# Patient Record
Sex: Male | Born: 1990 | Race: Black or African American | Hispanic: No | Marital: Single | Smoking: Former smoker
Health system: Southern US, Community
[De-identification: ages and names within clinical notes are randomized; demographics above are authoritative.]

## PROBLEM LIST (undated history)

## (undated) DIAGNOSIS — J45909 Unspecified asthma, uncomplicated: Secondary | ICD-10-CM

## (undated) HISTORY — PX: NECK SURGERY: SHX720

---
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
## 2015-01-01 ENCOUNTER — Emergency Department
Admission: EM | Admit: 2015-01-01 | Discharge: 2015-01-01 | Disposition: A | Discharge status: Home / Self Care | Payer: Medicare Other | Attending: Emergency Medicine | Admitting: Emergency Medicine

## 2015-01-01 ENCOUNTER — Emergency Department: Payer: Medicare Other

## 2015-01-01 ENCOUNTER — Other Ambulatory Visit: Payer: Self-pay

## 2015-01-01 DIAGNOSIS — R079 Chest pain, unspecified: Secondary | ICD-10-CM | POA: Diagnosis not present

## 2015-01-01 DIAGNOSIS — R0602 Shortness of breath: Secondary | ICD-10-CM | POA: Diagnosis not present

## 2015-01-01 LAB — COMPREHENSIVE METABOLIC PANEL
ALBUMIN: 4.7 g/dL (ref 3.5–5.0)
ALK PHOS: 54 U/L (ref 38–126)
ALT: 14 U/L — ABNORMAL LOW (ref 17–63)
AST: 21 U/L (ref 15–41)
Anion gap: 7 (ref 5–15)
BILIRUBIN TOTAL: 0.7 mg/dL (ref 0.3–1.2)
BUN: 8 mg/dL (ref 6–20)
CHLORIDE: 99 mmol/L — AB (ref 101–111)
CO2: 29 mmol/L (ref 22–32)
Calcium: 9.3 mg/dL (ref 8.9–10.3)
Creatinine, Ser: 0.83 mg/dL (ref 0.61–1.24)
GFR calc Af Amer: >60 mL/min (ref >60)
Glucose, Bld: 113 mg/dL — ABNORMAL HIGH (ref 65–99)
POTASSIUM: 3.6 mmol/L (ref 3.5–5.1)
Sodium: 135 mmol/L (ref 135–145)
Total Protein: 8.7 g/dL — ABNORMAL HIGH (ref 6.5–8.1)

## 2015-01-01 LAB — CBC
HCT: 48.4 % (ref 40.0–52.0)
Hemoglobin: 15.6 g/dL (ref 13.0–18.0)
MCH: 27.8 pg (ref 26.0–34.0)
MCHC: 32.3 g/dL (ref 32.0–36.0)
MCV: 86.1 fL (ref 80.0–100.0)
Platelets: 193 10*3/uL (ref 150–440)
RBC: 5.62 MIL/uL (ref 4.40–5.90)
RDW: 13.5 % (ref 11.5–14.5)
WBC: 5.9 10*3/uL (ref 3.8–10.6)

## 2015-01-01 LAB — TROPONIN I: Troponin I: <0.03 ng/mL (ref <0.031)

## 2015-01-01 MED ORDER — RANITIDINE HCL 75 MG PO TABS: 75.0000 mg | ORAL_TABLET | Freq: Two times a day (BID) | ORAL | Status: DC

## 2015-01-01 NOTE — ED Notes (Signed)
Pt in with co chest pain and shob x 2 days.

## 2015-01-01 NOTE — ED Notes (Signed)
First nurse note: pt presents to STAT desk ambulatory with steady gait. States he feels like he has "gas" in his chest and is having difficulty breathing. Pt has no increased work of breathing noted at this time.

## 2015-01-01 NOTE — ED Notes (Signed)
AAOx3.  Skin warm and dry.  No SOB/ DOE.  DEnies any c/o pain.  D/C home

## 2015-01-01 NOTE — ED Provider Notes (Signed)
[facility] Emergency Department Provider Note  ____________________________________________  Time seen: Approximately 9 PM  I have reviewed the triage vital signs and the nursing notes.   HISTORY  Chief Complaint Chest Pain    HPI Derek Spencer is a 24 y.o. male without any medical history who presents today with chest pain over the past 2 weeks. He says the pain is bilateral to his anterior chest and nonradiating. He has had some associated shortness of breath today when the pain has gotten severe. 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 He has not had any worsening of his shortness of breath or worsening of his chest pain with activity at work. He says that he also does a lot of heavy lifting at work lifting up 200 pounds at a time. He says that the pain has not been worsened with lifting either. He does say that he has had some increased stress recently and he thinks that may be contributing to the chest pain. He says that the pain improves at night and is worse during the daytime. He denies any nausea or vomiting. He has not had any relatives who have died suddenly at a young age. There is no increase in the pain with deep breathing. He has not had any recent long car trips or plane trips. The patient recently stopped smoking several days ago. No recent surgeries. He says that he sometimes hears and feels a gurgling in his stomach when he has the pain. Says that he has had increased belching as well as passing gas. However, he says the belching and passing gas do not alleviate the pain. He says that the pain is sometimes lessened after eating food. Does not report any blood in his stool.   No past medical history on file.  There are no active problems to display for this patient.   No past surgical history on file.  No current outpatient prescriptions on file.  Allergies Review of  patient's allergies indicates no known allergies.  No family history on file.  Social History History  Substance Use Topics  . Smoking status: Not on file  . Smokeless tobacco: Not on file  . Alcohol Use: Not on file    Review of Systems Constitutional: No fever/chills Eyes: No visual changes. ENT: No sore throat. Cardiovascular: As above Respiratory: As above Gastrointestinal: No abdominal pain.  No nausea, no vomiting.  No diarrhea.  No constipation. Genitourinary: Negative for dysuria. Musculoskeletal: Negative for back pain. Skin: Negative for rash. Neurological: Negative for headaches, focal weakness or numbness.  10-point ROS otherwise negative.  ____________________________________________   PHYSICAL EXAM:  VITAL SIGNS: ED Triage Vitals  Enc Vitals Group     BP 01/01/15 2013 160/104 mmHg     Pulse Rate 01/01/15 2013 84     Resp 01/01/15 2013 18     Temp 01/01/15 2013 98.6 F (37 C)     Temp src --      SpO2 01/01/15 2013 98 %     Weight 01/01/15 2013 190 lb (86.183 kg)     Height 01/01/15 2013  (1.854 m)     Head Cir --      Peak Flow --      Pain Score 01/01/15 2014 5     Pain Loc --      Pain Edu? --      Excl. in GC? --  Constitutional: Alert and oriented. Well appearing and in no acute distress. Eyes: Conjunctivae are normal. PERRL. EOMI. Head: Atraumatic. Nose: No congestion/rhinnorhea. Mouth/Throat: Mucous membranes are moist.  Oropharynx non-erythematous. Neck: No stridor.   Cardiovascular: Normal rate, regular rhythm. Grossly normal heart sounds.  Good peripheral circulation. No friction rub. Respiratory: Normal respiratory effort.  No retractions. Lungs CTAB. Gastrointestinal: Soft and nontender. No distention. No abdominal bruits. No CVA tenderness. Musculoskeletal: No lower extremity tenderness nor edema.  No joint effusions. Neurologic:  Normal speech and language. No gross focal neurologic deficits are appreciated. Speech is  normal. No gait instability. Skin:  Skin is warm, dry and intact. No rash noted. Psychiatric: Mood and affect are normal. Speech and behavior are normal.  ____________________________________________   LABS (all labs ordered are listed, but only abnormal results are displayed)  Labs Reviewed  COMPREHENSIVE METABOLIC PANEL - Abnormal; Notable for the following:    Chloride 99 (*)    Glucose, Bld 113 (*)    Total Protein 8.7 (*)    ALT 14 (*)    All other components within normal limits  CBC  TROPONIN I   ____________________________________________  EKG  ED ECG REPORT I, Arelia Longest, the attending physician, personally viewed and interpreted this ECG.   Date: 01/01/2015  EKG Time: 2018  Rate: 66  Rhythm: normal sinus rhythm  Axis: Normal axis  Intervals:none  ST&T Change: Concave and minimal ST elevation to V2 and V3. Consistent with J-point elevation.    ____________________________________________  RADIOLOGY  Chest x-ray NAD. I personally reviewed these images as well. ____________________________________________   PROCEDURES    ____________________________________________   INITIAL IMPRESSION / ASSESSMENT AND PLAN / ED COURSE  Pertinent labs & imaging results that were available during my care of the patient were reviewed by me and considered in my medical decision making (see chart for details).  ----------------------------------------- 10:03 PM on 01/01/2015 -----------------------------------------  Blood pressure rechecked and is 149/92. Patient to follow-up with his primary care doctor. The patient is perc negative.  Patient to try ibuprofen and muscle rubs at home such as icy hot or Aspercreme. Also appears to be some GI component to this. We will prescribe ranitidine for trial of pain reduction at home. Patient to follow-up with his primary care doctor at [facility] for further evaluation as well as trending of his blood pressure. Extremely  unlikely to be ACS as the patient is for a young for this. No friction rub and ST elevation more consistent with J-point.  ____________________________________________   FINAL CLINICAL IMPRESSION(S) / ED DIAGNOSES  Acute chest pain. Initial visit.    Myrna Blazer, MD 01/01/15 [phone]

## 2015-01-01 NOTE — Discharge Instructions (Signed)

## 2015-06-13 ENCOUNTER — Encounter: Payer: Self-pay | Admitting: Emergency Medicine

## 2015-06-13 ENCOUNTER — Emergency Department
Admission: EM | Admit: 2015-06-13 | Discharge: 2015-06-13 | Disposition: A | Discharge status: Home / Self Care | Payer: Medicare Other | Attending: Emergency Medicine | Admitting: Emergency Medicine

## 2015-06-13 DIAGNOSIS — Z79899 Other long term (current) drug therapy: Secondary | ICD-10-CM | POA: Insufficient documentation

## 2015-06-13 DIAGNOSIS — J45901 Unspecified asthma with (acute) exacerbation: Secondary | ICD-10-CM | POA: Insufficient documentation

## 2015-06-13 DIAGNOSIS — F17200 Nicotine dependence, unspecified, uncomplicated: Secondary | ICD-10-CM | POA: Diagnosis not present

## 2015-06-13 DIAGNOSIS — J069 Acute upper respiratory infection, unspecified: Secondary | ICD-10-CM | POA: Insufficient documentation

## 2015-06-13 DIAGNOSIS — R0981 Nasal congestion: Secondary | ICD-10-CM | POA: Diagnosis present

## 2015-06-13 MED ORDER — HYDROCOD POLST-CPM POLST ER 10-8 MG/5ML PO SUER
5.0000 mL | Freq: Once | ORAL | Status: AC
  Administered 2015-06-13: 5 mL via ORAL
  Filled 2015-06-13: qty 5

## 2015-06-13 MED ORDER — IBUPROFEN 800 MG PO TABS: 800.0000 mg | ORAL_TABLET | Freq: Three times a day (TID) | ORAL | Status: DC | PRN

## 2015-06-13 MED ORDER — IBUPROFEN 800 MG PO TABS
800.0000 mg | ORAL_TABLET | Freq: Once | ORAL | Status: AC
  Administered 2015-06-13: 800 mg via ORAL
  Filled 2015-06-13: qty 1

## 2015-06-13 MED ORDER — PROMETHAZINE-DM 6.25-15 MG/5ML PO SYRP: 5.0000 mL | ORAL_SOLUTION | Freq: Four times a day (QID) | ORAL | Status: DC | PRN

## 2015-06-13 NOTE — ED Notes (Signed)
pt reports feeling sick, shortness of breath with productive cough

## 2015-06-13 NOTE — Discharge Instructions (Signed)
Upper Respiratory Infection, Adult Most upper respiratory infections (URIs) are a viral infection of the air passages leading to the lungs. A URI affects the nose, throat, and upper air passages. The most common type of URI is nasopharyngitis and is typically referred to as "the common cold." URIs run their course and usually go away on their own. Most of the time, a URI does not require medical attention, but sometimes a bacterial infection in the upper airways can follow a viral infection. This is called a secondary infection. Sinus and middle ear infections are common types of secondary upper respiratory infections. Bacterial pneumonia can also complicate a URI. A URI can worsen asthma and chronic obstructive pulmonary disease (COPD). Sometimes, these complications can require emergency medical care and may be life threatening.  CAUSES Almost all URIs are caused by viruses. A virus is a type of germ and can spread from one person to another.  RISKS FACTORS You may be at risk for a URI if:   You smoke.   You have chronic heart or lung disease.  You have a weakened defense (immune) system.   You are very young or very old.   You have nasal allergies or asthma.  You work in crowded or poorly ventilated areas.  You work in health care facilities or schools. SIGNS AND SYMPTOMS  Symptoms typically develop 2-3 days after you come in contact with a cold virus. Most viral URIs last 7-10 days. However, viral URIs from the influenza virus (flu virus) can last 14-18 days and are typically more severe. Symptoms may include:   Runny or stuffy (congested) nose.   Sneezing.   Cough.   Sore throat.   Headache.   Fatigue.   Fever.   Loss of appetite.   Pain in your forehead, behind your eyes, and over your cheekbones (sinus pain).  Muscle aches.  DIAGNOSIS  Your health care provider may diagnose a URI by:  Physical exam.  Tests to check that your symptoms are not due to  another condition such as:  Strep throat.  Sinusitis.  Pneumonia.  Asthma. TREATMENT  A URI goes away on its own with time. It cannot be cured with medicines, but medicines may be prescribed or recommended to relieve symptoms. Medicines may help:  Reduce your fever.  Reduce your cough.  Relieve nasal congestion. HOME CARE INSTRUCTIONS   Take medicines only as directed by your health care provider.   Gargle warm saltwater or take cough drops to comfort your throat as directed by your health care provider.  Use a warm mist humidifier or inhale steam from a shower to increase air moisture. This may make it easier to breathe.  Drink enough fluid to keep your urine clear or pale yellow.   Eat soups and other clear broths and maintain good nutrition.   Rest as needed.   Return to work when your temperature has returned to normal or as your health care provider advises. You may need to stay home longer to avoid infecting others. You can also use a face mask and careful hand washing to prevent spread of the virus.  Increase the usage of your inhaler if you have asthma.   Do not use any tobacco products, including cigarettes, chewing tobacco, or electronic cigarettes. If you need help quitting, ask your health care provider. PREVENTION  The best way to protect yourself from getting a cold is to practice good hygiene.   Avoid oral or hand contact with people with cold   symptoms.   Wash your hands often if contact occurs.  There is no clear evidence that vitamin C, vitamin E, echinacea, or exercise reduces the chance of developing a cold. However, it is always recommended to get plenty of rest, exercise, and practice good nutrition.  SEEK MEDICAL CARE IF:   You are getting worse rather than better.   Your symptoms are not controlled by medicine.   You have chills.  You have worsening shortness of breath.  You have brown or red mucus.  You have yellow or brown nasal  discharge.  You have pain in your face, especially when you bend forward.  You have a fever.  You have swollen neck glands.  You have pain while swallowing.  You have white areas in the back of your throat. SEEK IMMEDIATE MEDICAL CARE IF:   You have severe or persistent:  Headache.  Ear pain.  Sinus pain.  Chest pain.  You have chronic lung disease and any of the following:  Wheezing.  Prolonged cough.  Coughing up blood.  A change in your usual mucus.  You have a stiff neck.  You have changes in your:  Vision.  Hearing.  Thinking.  Mood. MAKE SURE YOU:   Understand these instructions.  Will watch your condition.  Will get help right away if you are not doing well or get worse.   This information is not intended to replace advice given to you by your health care provider. Make sure you discuss any questions you have with your health care provider.   Document Released: 12/22/2000 Document Revised: 11/12/2014 Document Reviewed: 10/03/2013 Elsevier Interactive Patient Education 2016 [employer]  

## 2015-06-13 NOTE — ED Provider Notes (Signed)
[facility] Emergency Department Provider Note  ____________________________________________  Time seen: Approximately 9:20 PM  I have reviewed the triage vital signs and the nursing notes.   HISTORY  Chief Complaint URI    HPI BRODY BONNEAU is a 24 y.o. male patient complain of nasal and chest congestion and malaise for 3 days. Patient stated he had a coughing spell with developmental wheezing. Patient state he has a history of asthma as a child but outgrew them but it felt like an asthma attack. Patient stated wheezing resolved couple hours ago prior to arrival. Patient denies any nausea, vomiting, diarrhea. No palliative measures taken for this complaint.   History reviewed. No pertinent past medical history.  There are no active problems to display for this patient.   Past Surgical History  Procedure Laterality Date  . Neck surgery      Current Outpatient Rx  Name  Route  Sig  Dispense  Refill  . ranitidine (ZANTAC) 75 MG tablet   Oral   Take 1 tablet (75 mg total) by mouth 2 (two) times daily.   30 tablet   0     Allergies Review of patient's allergies indicates no known allergies.  History reviewed. No pertinent family history.  Social History Social History  Substance Use Topics  . Smoking status: Current Every Day Smoker  . Smokeless tobacco: None  . Alcohol Use: Yes     Comment: social    Review of Systems Constitutional: No fever/chills Eyes: No visual changes. ENT: No sore throat. Positive for sinus congestion Cardiovascular: Denies chest pain. Respiratory: Denies shortness of breath. Nonproductive cough. Chest congestion Gastrointestinal: No abdominal pain.  No nausea, no vomiting.  No diarrhea.  No constipation. Genitourinary: Negative for dysuria. Musculoskeletal: Negative for back pain. Skin: Negative for rash. Neurological: Negative for headaches, focal weakness or numbness. 10-point ROS otherwise  negative.  ____________________________________________   PHYSICAL EXAM:  VITAL SIGNS: ED Triage Vitals  Enc Vitals Group     BP 06/13/15 2059 164/68 mmHg     Pulse Rate 06/13/15 2059 70     Resp 06/13/15 2059 20     Temp 06/13/15 2059 98 F (36.7 C)     Temp Source 06/13/15 2059 Oral     SpO2 06/13/15 2059 100 %     Weight 06/13/15 2059 200 lb (90.719 kg)     Height 06/13/15 2059  (1.854 m)     Head Cir --      Peak Flow --      Pain Score 06/13/15 2100 2     Pain Loc --      Pain Edu? --      Excl. in GC? --     Constitutional: Alert and oriented. Well appearing and in no acute distress. Eyes: Conjunctivae are normal. PERRL. EOMI. Head: Atraumatic. Nose: Bilateral maxillary guarding and edematous bilateral nasal turbinates.  Mouth/Throat: Mucous membranes are moist.  Oropharynx non-erythematous. Postnasal drainage Neck: No stridor.  No cervical spine tenderness to palpation. Hematological/Lymphatic/Immunilogical: No cervical lymphadenopathy. Cardiovascular: Normal rate, regular rhythm. Grossly normal heart sounds.  Good peripheral circulation. Elevated blood pressure Respiratory: Normal respiratory effort.  No retractions. Lungs CTAB. Nonproductive cough Gastrointestinal: Soft and nontender. No distention. No abdominal bruits. No CVA tenderness. Musculoskeletal: No lower extremity tenderness nor edema.  No joint effusions. Neurologic:  Normal speech and language. No gross focal neurologic deficits are appreciated. No gait instability. Skin:  Skin is warm, dry and intact. No rash noted. Psychiatric:  Mood and affect are normal. Speech and behavior are normal.  ____________________________________________   LABS (all labs ordered are listed, but only abnormal results are displayed)  Labs Reviewed - No data to  display ____________________________________________  EKG   ____________________________________________  RADIOLOGY   ____________________________________________   PROCEDURES  Procedure(s) performed: None  Critical Care performed: No  ____________________________________________   INITIAL IMPRESSION / ASSESSMENT AND PLAN / ED COURSE  Pertinent labs & imaging results that were available during my care of the patient were reviewed by me and considered in my medical decision making (see chart for details).  Upper rest or infection. Patient given instruction in home care. Patient given a prescription for Phenergan DM and ibuprofen. Patient [facility] condition persists. ____________________________________________   FINAL CLINICAL IMPRESSION(S) / ED DIAGNOSES  Final diagnoses:  Upper respiratory infection      Joni ReiningRonald K Smith, PA-C 06/13/15 2129  Loleta Roseory Forbach, MD 06/13/15 [phone]

## 2015-06-13 NOTE — ED Notes (Signed)
Pt came to ER from home with significant other. Pt reports that he has been feeling congested and "not well" for about 3 days now. Pt reports sinus congestion. Pt also reports that his chest feels "tight about a 3/10". Pt with hx of asthma that he outgrew as a child. Pt does report that earlier today he was wheezing.  No other medical issues verbalized at this time. Pt speaking in full sentences.

## 2015-08-09 ENCOUNTER — Emergency Department
Admission: EM | Admit: 2015-08-09 | Discharge: 2015-08-09 | Disposition: A | Discharge status: Home / Self Care | Payer: Medicare Other | Attending: Emergency Medicine | Admitting: Emergency Medicine

## 2015-08-09 ENCOUNTER — Encounter: Payer: Self-pay | Admitting: Emergency Medicine

## 2015-08-09 DIAGNOSIS — R5383 Other fatigue: Secondary | ICD-10-CM | POA: Diagnosis not present

## 2015-08-09 DIAGNOSIS — R1012 Left upper quadrant pain: Secondary | ICD-10-CM | POA: Diagnosis not present

## 2015-08-09 DIAGNOSIS — Z79899 Other long term (current) drug therapy: Secondary | ICD-10-CM | POA: Diagnosis not present

## 2015-08-09 DIAGNOSIS — M791 Myalgia: Secondary | ICD-10-CM | POA: Diagnosis not present

## 2015-08-09 DIAGNOSIS — R509 Fever, unspecified: Secondary | ICD-10-CM | POA: Diagnosis not present

## 2015-08-09 DIAGNOSIS — R590 Localized enlarged lymph nodes: Secondary | ICD-10-CM | POA: Diagnosis not present

## 2015-08-09 DIAGNOSIS — F17200 Nicotine dependence, unspecified, uncomplicated: Secondary | ICD-10-CM | POA: Diagnosis not present

## 2015-08-09 DIAGNOSIS — R52 Pain, unspecified: Secondary | ICD-10-CM | POA: Diagnosis present

## 2015-08-09 DIAGNOSIS — R5381 Other malaise: Secondary | ICD-10-CM | POA: Diagnosis not present

## 2015-08-09 LAB — RAPID INFLUENZA A&B ANTIGENS
Influenza A (ARMC): NEGATIVE
Influenza B (ARMC): NEGATIVE

## 2015-08-09 LAB — MONONUCLEOSIS SCREEN: MONO SCREEN: NEGATIVE

## 2015-08-09 NOTE — Discharge Instructions (Signed)
Fatigue  Fatigue is feeling tired all of the time, a lack of energy, or a lack of motivation. Occasional or mild fatigue is often a normal response to activity or life in general. However, long-lasting (chronic) or extreme fatigue may indicate an underlying medical condition.  HOME CARE INSTRUCTIONS   Watch your fatigue for any changes. The following actions may help to lessen any discomfort you are feeling:  · Talk to your health care provider about how much sleep you need each night. Try to get the required amount every night.  · Take medicines only as directed by your health care provider.  · Eat a healthy and nutritious diet. Ask your health care provider if you need help changing your diet.  · Drink enough fluid to keep your urine clear or pale yellow.  · Practice ways of relaxing, such as yoga, meditation, massage therapy, or acupuncture.  · Exercise regularly.    · Change situations that cause you stress. Try to keep your work and personal routine reasonable.  · Do not abuse illegal drugs.  · Limit alcohol intake to no more than 1 drink per day for nonpregnant women and 2 drinks per day for men. One drink equals 12 ounces of beer, 5 ounces of wine, or 1½ ounces of hard liquor.  · Take a multivitamin, if directed by your health care provider.  SEEK MEDICAL CARE IF:   · Your fatigue does not get better.  · You have a fever.    · You have unintentional weight loss or gain.  · You have headaches.    · You have difficulty:      Falling asleep.    Sleeping throughout the night.  · You feel angry, guilty, anxious, or sad.     · You are unable to have a bowel movement (constipation).    · You skin is dry.     · Your legs or another part of your body is swollen.    SEEK IMMEDIATE MEDICAL CARE IF:   · You feel confused.    · Your vision is blurry.  · You feel faint or pass out.    · You have a severe headache.    · You have severe abdominal, pelvic, or back pain.    · You have chest pain, shortness of breath, or an  irregular or fast heartbeat.    · You are unable to urinate or you urinate less than normal.    · You develop abnormal bleeding, such as bleeding from the rectum, vagina, nose, lungs, or nipples.  · You vomit blood.     · You have thoughts about harming yourself or committing suicide.    · You are worried that you might harm someone else.       This information is not intended to replace advice given to you by your health care provider. Make sure you discuss any questions you have with your health care provider.     Document Released: 04/25/2007 Document Revised: 07/19/2014 Document Reviewed: 10/30/2013  Elsevier Interactive Patient Education ©2016 [employer]

## 2015-08-09 NOTE — ED Provider Notes (Signed)
[facility] Emergency Department Provider Note  ____________________________________________  Time seen: Approximately 11:55 AM  I have reviewed the triage vital signs and the nursing notes.   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 Patient states that symptoms began with "aching." Patient states that initially aching was more arthralgias to bilateral lower extremity joints and then increase to includes general achiness. Patient has had a subjective tactile fever at home. Patient denies any nasal congestion, sore throat, cough, nausea or vomiting, abdominal pain. Patient does endorse feeling "really tired."   History reviewed. No pertinent past medical history.  There are no active problems to display for this patient.   Past Surgical History  Procedure Laterality Date  . Neck surgery      Current Outpatient Rx  Name  Route  Sig  Dispense  Refill  . ibuprofen (ADVIL,MOTRIN) 800 MG tablet   Oral   Take 1 tablet (800 mg total) by mouth every 8 (eight) hours as needed.   30 tablet   0   . promethazine-dextromethorphan (PROMETHAZINE-DM) 6.25-15 MG/5ML syrup   Oral   Take 5 mLs by mouth 4 (four) times daily as needed for cough.   118 mL   0   . ranitidine (ZANTAC) 75 MG tablet   Oral   Take 1 tablet (75 mg total) by mouth 2 (two) times daily.   30 tablet   0     Allergies Review of patient's allergies indicates no known allergies.  History reviewed. No pertinent family history.  Social History Social History  Substance Use Topics  . Smoking status: Current Every Day Smoker  . Smokeless tobacco: None  . Alcohol Use: Yes     Comment: social     Review of Systems  Constitutional: Positive for fever/chills. Endorses significant tiredness. Eyes: No visual changes. No discharge ENT: No sore  throat. Cardiovascular: no chest pain. Respiratory: no cough. No SOB. Gastrointestinal: No abdominal pain.  No nausea, no vomiting.  No diarrhea.  No constipation. Genitourinary: Negative for dysuria. No hematuria Musculoskeletal: Negative for back pain. Endorses myalgias. Skin: Negative for rash. Neurological: Negative for headaches, focal weakness or numbness. 10-point ROS otherwise negative.  ____________________________________________   PHYSICAL EXAM:  VITAL SIGNS: ED Triage Vitals  Enc Vitals Group     BP 08/09/15 1053 156/84 mmHg     Pulse Rate 08/09/15 1053 92     Resp 08/09/15 1053 18     Temp 08/09/15 1053 98.5 F (36.9 C)     Temp Source 08/09/15 1053 Oral     SpO2 08/09/15 1053 100 %     Weight 08/09/15 1053 210 lb (95.255 kg)     Height 08/09/15 1053  (1.854 m)     Head Cir --      Peak Flow --      Pain Score 08/09/15 1053 7     Pain Loc --      Pain Edu? --      Excl. in GC? --      Constitutional: Alert and oriented. Well appearing and in no acute distress. Eyes: Conjunctivae are normal. PERRL. EOMI. Head: Atraumatic. ENT:      Ears: ACs and TMs are unremarkable bilaterally.      Nose: No congestion/rhinnorhea.      Mouth/Throat: Mucous membranes are moist. Oropharynx is nonerythematous and nonedematous  Neck: No stridor.   Hematological/Lymphatic/Immunilogical: Diffuse anterior cervical lymphadenopathy. Cardiovascular: Normal rate, regular rhythm. Normal S1 and S2.  Good peripheral circulation. Respiratory: Normal respiratory effort without tachypnea or retractions. Lungs CTAB. Gastrointestinal: Soft to palpation. Patient reports mild tenderness to palpation left upper quadrant. No palpable abnormality. No palpable splenomegaly.. No distention. No CVA tenderness. Musculoskeletal: No lower extremity tenderness nor edema.  No joint effusions. Neurologic:  Normal speech and language. No gross focal neurologic deficits are appreciated. Cranial nerves  II through XII are grossly normal. Skin:  Skin is warm, dry and intact. No rash noted. Psychiatric: Mood and affect are normal. Speech and behavior are normal. Patient exhibits appropriate insight and judgement.   ____________________________________________   LABS (all labs ordered are listed, but only abnormal results are displayed)  Labs Reviewed  RAPID INFLUENZA A&B ANTIGENS ([facility] ONLY)  MONONUCLEOSIS SCREEN   ____________________________________________  EKG   ____________________________________________  RADIOLOGY   No results found.  ____________________________________________    PROCEDURES  Procedure(s) performed:       Medications - No data to display   ____________________________________________   INITIAL IMPRESSION / ASSESSMENT AND PLAN / ED COURSE  Pertinent labs & imaging results that were available during my care of the patient were reviewed by me and considered in my medical decision making (see chart for details).  Patient's diagnosis is consistent with malaise and fatigue. There is no signs of infection at this time. She had a negative flu and mono.. Patient is to rest, take Tylenol or ibuprofen if needed, drink plenty of fluids Patient is to follow up with primary care provider if symptoms persist past this treatment course. Patient is given ED precautions to return to the ED for any worsening or new symptoms.     ____________________________________________  FINAL CLINICAL IMPRESSION(S) / ED DIAGNOSES  Final diagnoses:  Malaise and fatigue      NEW MEDICATIONS STARTED DURING THIS VISIT:  New Prescriptions   No medications on file        Racheal Patches, PA-C 08/09/15 1312  Minna Antis, MD 08/09/15 1427

## 2015-08-09 NOTE — ED Notes (Signed)
Reports bodyaches, fevers and headache x 3 days

## 2016-01-02 ENCOUNTER — Ambulatory Visit
Admission: EM | Admit: 2016-01-02 | Discharge: 2016-01-02 | Disposition: A | Discharge status: Home / Self Care | Payer: Medicare Other | Attending: Family Medicine | Admitting: Family Medicine

## 2016-01-02 ENCOUNTER — Other Ambulatory Visit: Payer: Self-pay

## 2016-01-02 DIAGNOSIS — R55 Syncope and collapse: Secondary | ICD-10-CM | POA: Diagnosis not present

## 2016-01-02 DIAGNOSIS — J45909 Unspecified asthma, uncomplicated: Secondary | ICD-10-CM | POA: Diagnosis not present

## 2016-01-02 DIAGNOSIS — R002 Palpitations: Secondary | ICD-10-CM | POA: Diagnosis present

## 2016-01-02 DIAGNOSIS — F17210 Nicotine dependence, cigarettes, uncomplicated: Secondary | ICD-10-CM | POA: Diagnosis not present

## 2016-01-02 HISTORY — DX: Unspecified asthma, uncomplicated: J45.909

## 2016-01-02 LAB — BASIC METABOLIC PANEL
ANION GAP: 6 (ref 5–15)
BUN: 11 mg/dL (ref 6–20)
CALCIUM: 9 mg/dL (ref 8.9–10.3)
CO2: 26 mmol/L (ref 22–32)
Chloride: 103 mmol/L (ref 101–111)
Creatinine, Ser: 0.81 mg/dL (ref 0.61–1.24)
Glucose, Bld: 92 mg/dL (ref 65–99)
Potassium: 3.5 mmol/L (ref 3.5–5.1)
SODIUM: 135 mmol/L (ref 135–145)

## 2016-01-02 LAB — CBC WITH DIFFERENTIAL/PLATELET
BASOS ABS: 0 10*3/uL (ref 0–0.1)
BASOS PCT: 1 %
Eosinophils Absolute: 0.1 10*3/uL (ref 0–0.7)
Eosinophils Relative: 2 %
HCT: 47.5 % (ref 40.0–52.0)
Hemoglobin: 15.6 g/dL (ref 13.0–18.0)
Lymphocytes Relative: 30 %
Lymphs Abs: 2.5 10*3/uL (ref 1.0–3.6)
MCH: 27.5 pg (ref 26.0–34.0)
MCHC: 33 g/dL (ref 32.0–36.0)
MCV: 83.4 fL (ref 80.0–100.0)
MONO ABS: 0.8 10*3/uL (ref 0.2–1.0)
Monocytes Relative: 9 %
NEUTROS ABS: 5 10*3/uL (ref 1.4–6.5)
Neutrophils Relative %: 58 %
Platelets: 209 10*3/uL (ref 150–440)
RBC: 5.69 MIL/uL (ref 4.40–5.90)
RDW: 15 % — AB (ref 11.5–14.5)
WBC: 8.4 10*3/uL (ref 3.8–10.6)

## 2016-01-02 LAB — TSH: TSH: 1.947 u[IU]/mL (ref 0.350–4.500)

## 2016-01-02 NOTE — ED Provider Notes (Signed)
CSN: [id number]     Arrival date & time 01/02/16  1451 History   None    Chief Complaint  Patient presents with  . Palpitations   (Consider location/radiation/quality/duration/timing/severity/associated sxs/prior Treatment) HPI Comments: 25 yo male with a c/o an episode today of feeling "like I was going to pass out" and his heart "racing". States episode lasted less than one minute but has felt fatigued since then. Denies any chest pains, shortness of breath, fainting, numbness/tingling, one-sided weakness, recent illness. Currently states not feeling any symptoms.   The history is provided by the patient.    Past Medical History  Diagnosis Date  . Asthma    Past Surgical History  Procedure Laterality Date  . Neck surgery     History reviewed. No pertinent family history. Social History  Substance Use Topics  . Smoking status: Current Every Day Smoker -- 0.20 packs/day    Types: Cigarettes  . Smokeless tobacco: None  . Alcohol Use: 0.0 oz/week    0 Standard drinks or equivalent per week     Comment: social    Review of Systems  Allergies  Review of patient's allergies indicates no known allergies.  Home Medications   Prior to Admission medications   Medication Sig Start Date End Date Taking? Authorizing Provider  ibuprofen (ADVIL,MOTRIN) 800 MG tablet Take 1 tablet (800 mg total) by mouth every 8 (eight) hours as needed. 06/13/15   Joni Reiningonald K Smith, PA-C  promethazine-dextromethorphan (PROMETHAZINE-DM) 6.25-15 MG/5ML syrup Take 5 mLs by mouth 4 (four) times daily as needed for cough. 06/13/15   Joni Reiningonald K Smith, PA-C  ranitidine (ZANTAC) 75 MG tablet Take 1 tablet (75 mg total) by mouth 2 (two) times daily. 01/01/15   Myrna Blazeravid Matthew Schaevitz, MD   Meds Ordered and Administered this Visit  Medications - No data to display  BP 141/81 mmHg  Pulse 64  Temp(Src) 97.6 F (36.4 C) (Oral)  Resp 16  Ht 6\' 1"  (1.854 m)  Wt 196 lb (88.905 kg)  BMI 25.86 kg/m2  SpO2 99% No data  found.   Physical Exam  Constitutional: He is oriented to person, place, and time. He appears well-developed and well-nourished. No distress.  HENT:  Head: Normocephalic and atraumatic.  Right Ear: Tympanic membrane, external ear and ear canal normal.  Left Ear: Tympanic membrane, external ear and ear canal normal.  Nose: Nose normal.  Mouth/Throat: Uvula is midline, oropharynx is clear and moist and mucous membranes are normal. No oropharyngeal exudate or tonsillar abscesses.  Eyes: Conjunctivae and EOM are normal. Pupils are equal, round, and reactive to light. Right eye exhibits no discharge. Left eye exhibits no discharge. No scleral icterus.  Neck: Normal range of motion. Neck supple. No tracheal deviation present. No thyromegaly present.  Cardiovascular: Normal rate, regular rhythm and normal heart sounds.   Pulmonary/Chest: Effort normal and breath sounds normal. No stridor. No respiratory distress. He has no wheezes. He has no rales. He exhibits no tenderness.  Lymphadenopathy:    He has no cervical adenopathy.  Neurological: He is alert and oriented to person, place, and time. He has normal reflexes. He displays normal reflexes. No cranial nerve deficit. He exhibits normal muscle tone. Coordination normal.  Skin: Skin is warm and dry. No rash noted. He is not diaphoretic.  Nursing note and vitals reviewed.   ED Course  Procedures (including critical care time)  Labs Review Labs Reviewed  CBC WITH DIFFERENTIAL/PLATELET - Abnormal; Notable for the following:    RDW 15.0 (*)  All other components within normal limits  BASIC METABOLIC PANEL  TSH    Imaging Review No results found.   Visual Acuity Review  Right Eye Distance:   Left Eye Distance:   Bilateral Distance:    Right Eye Near:   Left Eye Near:    Bilateral Near:      EKG: normal EKG, normal sinus rhythm, there are no previous tracings available for comparison; reviewed by me and agree.  MDM   1.  Pre-syncope   (currently asymptomatic and normal examination)  Discharge Medication List as of 01/02/2016  5:06 PM    1. Lab results (normal bmp and cbc; tsh pending) and diagnosis/possible etiologies reviewed with patient 2. Recommend supportive treatment with increased fluids, improved diet 3. Follow-up with PCP for further evaluation/possible referrals for further tests and/or specialist 4. F/u prn if symptoms worsen or don't improve    Derek Mccallumrlando Sinai Mahany, MD 01/02/16 2112

## 2016-01-02 NOTE — ED Notes (Signed)
Patient states that he feels like his heart is racing. Patient states that he was going to work today and felt faint. Patient states that he has not been sleeping very well and has been under stress recently.

## 2016-01-02 NOTE — Discharge Instructions (Signed)

## 2016-01-29 ENCOUNTER — Ambulatory Visit
Admission: EM | Admit: 2016-01-29 | Discharge: 2016-01-29 | Disposition: A | Discharge status: Home / Self Care | Payer: Medicare Other | Attending: Family Medicine | Admitting: Family Medicine

## 2016-01-29 DIAGNOSIS — S39012A Strain of muscle, fascia and tendon of lower back, initial encounter: Secondary | ICD-10-CM | POA: Diagnosis not present

## 2016-01-29 MED ORDER — NAPROXEN 500 MG PO TABS: 500.0000 mg | ORAL_TABLET | Freq: Two times a day (BID) | ORAL | Status: DC

## 2016-01-29 MED ORDER — TIZANIDINE HCL 4 MG PO TABS: 4.0000 mg | ORAL_TABLET | Freq: Four times a day (QID) | ORAL | Status: DC | PRN

## 2016-01-29 NOTE — ED Provider Notes (Signed)
CSN: [id number]     Arrival date & time 01/29/16  1349 History   First MD Initiated Contact with Patient 01/29/16 1610     Chief Complaint  Patient presents with  . Back Pain   (Consider location/radiation/quality/duration/timing/severity/associated sxs/prior Treatment) HPI  This is a 25 year old gentleman who works for [employer]. He states that yesterday they had inadvertently loaded the wrong truck and in an effort to load the proper truck they had to unload the first rapidly in order to fill the second properly.InThe effort he was moving a large box lifting and twisting to push it along the rollers when he had felt a sudden painful pop in his right lower back. At first it did not bother him badly; when he awoke this morning his noticed that he has been more painful throughout the day particularly with certain motions.No radicular symptoms. He denies any incontinence. Never had a back injury previously.    Past Medical History  Diagnosis Date  . Asthma    Past Surgical History  Procedure Laterality Date  . Neck surgery     No family history on file. Social History  Substance Use Topics  . Smoking status: Current Every Day Smoker -- 0.20 packs/day    Types: Cigarettes  . Smokeless tobacco: None  . Alcohol Use: 0.0 oz/week    0 Standard drinks or equivalent per week     Comment: social    Review of Systems  Constitutional: Positive for activity change. Negative for fever, chills and fatigue.  Musculoskeletal: Positive for back pain.  All other systems reviewed and are negative.   Allergies  Review of patient's allergies indicates no known allergies.  Home Medications   Prior to Admission medications   Medication Sig Start Date End Date Taking? Authorizing Provider  ibuprofen (ADVIL,MOTRIN) 800 MG tablet Take 1 tablet (800 mg total) by mouth every 8 (eight) hours as needed. 06/13/15   Joni Reiningonald K Smith, PA-C  naproxen (NAPROSYN) 500 MG tablet Take 1 tablet (500 mg  total) by mouth 2 (two) times daily with a meal. 01/29/16   Lutricia FeilWilliam P Roemer, PA-C  promethazine-dextromethorphan (PROMETHAZINE-DM) 6.25-15 MG/5ML syrup Take 5 mLs by mouth 4 (four) times daily as needed for cough. 06/13/15   Joni Reiningonald K Smith, PA-C  ranitidine (ZANTAC) 75 MG tablet Take 1 tablet (75 mg total) by mouth 2 (two) times daily. 01/01/15   Myrna Blazeravid Matthew Schaevitz, MD  tiZANidine (ZANAFLEX) 4 MG tablet Take 1 tablet (4 mg total) by mouth every 6 (six) hours as needed for muscle spasms. 01/29/16   Lutricia FeilWilliam P Roemer, PA-C   Meds Ordered and Administered this Visit  Medications - No data to display  BP 135/78 mmHg  Pulse 49  Temp(Src) 97.5 F (36.4 C) (Oral)  Resp 16  Ht 6\' 1"  (1.854 m)  Wt 195 lb (88.451 kg)  BMI 25.73 kg/m2  SpO2 100% No data found.   Physical Exam  Constitutional: He is oriented to person, place, and time. He appears well-developed and well-nourished. No distress.  HENT:  Head: Normocephalic and atraumatic.  Eyes: Conjunctivae are normal. Pupils are equal, round, and reactive to light.  Neck: Normal range of motion. Neck supple.  Musculoskeletal: Normal range of motion. He exhibits edema and tenderness.  Examination of the lumbar spine shows a level pelvis in stance. He has obvious muscle spasm in the right paraspinous muscles that are tender to palpation. Able to forward flex touching his feet with his fingertips regaining erect posture is painful.  Lateral flexion does cause him to have discomfort with lateral flexion but not so much with right. Able to toe and heel walk adequately. Straight leg raise testing in the seated position is negative at 90 bilaterally.  Neurological: He is alert and oriented to person, place, and time. He exhibits normal muscle tone. Coordination normal.  Skin: Skin is warm and dry. He is not diaphoretic.  Psychiatric: He has a normal mood and affect. His behavior is normal. Judgment and thought content normal.  Nursing note and vitals  reviewed.   ED Course  Procedures (including critical care time)  Labs Review Labs Reviewed - No data to display  Imaging Review No results found.   Visual Acuity Review  Right Eye Distance:   Left Eye Distance:   Bilateral Distance:    Right Eye Near:   Left Eye Near:    Bilateral Near:         MDM   1. Strain of lumbar paraspinal muscle, initial encounter    Discharge Medication List as of 01/29/2016  4:24 PM    START taking these medications   Details  naproxen (NAPROSYN) 500 MG tablet Take 1 tablet (500 mg total) by mouth 2 (two) times daily with a meal., Starting 01/29/2016, Until Discontinued, Normal    tiZANidine (ZANAFLEX) 4 MG tablet Take 1 tablet (4 mg total) by mouth every 6 (six) hours as needed for muscle spasms., Starting 01/29/2016, Until Discontinued, Normal      Plan: 1. Test/x-ray results and diagnosis reviewed with patient 2. rx as per orders; risks, benefits, potential side effects reviewed with patient 3. Recommend supportive treatment with Heat and ice as necessary for comfort. We discussed symptom avoidance to allow healing to occur. I've given him an anti-inflammatory medication as well as a muscle relaxer. He will take anti-inflammatory with meals to prevent gastric irritation and I have cautioned him regarding the use of the muscle relaxer for activities that required judgment are concentration and he should not drive when taking. He should follow-up with his primary care if he is not progressing as physical therapy may be beneficial. 4. F/u prn if symptoms worsen or don't improve     Lutricia Feil, PA-C 01/29/16 1640

## 2016-01-29 NOTE — ED Notes (Signed)
Pt c/o lower back pain since lifting some heavy boxes yesterday.. Pt ambulatory without difficulty,..Marland Kitchen

## 2016-01-29 NOTE — Discharge Instructions (Signed)
Back Injury Prevention °Back injuries can be very painful. They can also be difficult to heal. After having one back injury, you are more likely to injure your back again. It is important to learn how to avoid injuring or re-injuring your back. The following tips can help you to prevent a back injury. °WHAT SHOULD I KNOW ABOUT PHYSICAL FITNESS? °· Exercise for 30 minutes per day on most days of the week or as directed by your health care provider. Make sure to: °· Do aerobic exercises, such as walking, jogging, biking, or swimming. °· Do exercises that increase balance and strength, such as tai chi and yoga. These can decrease your risk of falling and injuring your back. °· Do stretching exercises to help with flexibility. °· Try to develop strong abdominal muscles. Your abdominal muscles provide a lot of the support that is needed by your back. °· Maintain a healthy weight.  This helps to decrease your risk of a back injury. °WHAT SHOULD I KNOW ABOUT MY DIET? °· Talk with your health care provider about your overall diet. Take supplements and vitamins only as directed by your health care provider. °· Talk with your health care provider about how much calcium and vitamin D you need each day. These nutrients help to prevent weakening of the bones (osteoporosis). Osteoporosis can cause broken (fractured) bones, which lead to back pain. °· Include good sources of calcium in your diet, such as dairy products, green leafy vegetables, and products that have had calcium added to them (fortified). °· Include good sources of vitamin D in your diet, such as milk and foods that are fortified with vitamin D. °WHAT SHOULD I KNOW ABOUT MY POSTURE? °· Sit up straight and stand up straight. Avoid leaning forward when you sit or hunching over when you stand. °· Choose chairs that have good low-back (lumbar) support. °· If you work at a desk, sit close to it so you do not need to lean over. Keep your chin tucked in. Keep your neck  drawn back, and keep your elbows bent at a right angle. Your arms should look like the letter "L." °· Sit high and close to the steering wheel when you drive. Add a lumbar support to your car seat, if needed. °· Avoid sitting or standing in one position for very long. Take breaks to get up, stretch, and walk around at least one time every hour. Take breaks every hour if you are driving for long periods of time. °· Sleep on your side with your knees slightly bent, or sleep on your back with a pillow under your knees. Do not lie on the front of your body to sleep. °WHAT SHOULD I KNOW ABOUT LIFTING, TWISTING, AND REACHING? °Lifting and Heavy Lifting °· Avoid heavy lifting, especially repetitive heavy lifting. If you must do heavy lifting: °· Stretch before lifting. °· Work slowly. °· Rest between lifts. °· Use a tool such as a cart or a dolly to move objects if one is available. °· Make several small trips instead of carrying one heavy load. °· Ask for help when you need it, especially when moving big objects. °· Follow these steps when lifting: °· Stand with your feet shoulder-width apart. °· Get as close to the object as you can. Do not try to pick up a heavy object that is far from your body. °· Use handles or lifting straps if they are available. °· Bend at your knees. Squat down, but keep your heels off the floor. °·   Keep your shoulders pulled back, your chin tucked in, and your back straight. °· Lift the object slowly while you tighten the muscles in your legs, abdomen, and buttocks. Keep the object as close to the center of your body as possible. °· Follow these steps when putting down a heavy load: °· Stand with your feet shoulder-width apart. °· Lower the object slowly while you tighten the muscles in your legs, abdomen, and buttocks. Keep the object as close to the center of your body as possible. °· Keep your shoulders pulled back, your chin tucked in, and your back straight. °· Bend at your knees. Squat  down, but keep your heels off the floor. °· Use handles or lifting straps if they are available. °Twisting and Reaching °· Avoid lifting heavy objects above your waist. °· Do not twist at your waist while you are lifting or carrying a load. If you need to turn, move your feet. °· Do not bend over without bending at your knees. °· Avoid reaching over your head, across a table, or for an object on a high surface. °WHAT ARE SOME OTHER TIPS? °· Avoid wet floors and icy ground. Keep sidewalks clear of ice to prevent falls. °· Do not sleep on a mattress that is too soft or too hard. °· Keep items that are used frequently within easy reach. °· Put heavier objects on shelves at waist level, and put lighter objects on lower or higher shelves. °· Find ways to decrease your stress, such as exercise, massage, or relaxation techniques. Stress can build up in your muscles. Tense muscles are more vulnerable to injury. °· Talk with your health care provider if you feel anxious or depressed. These conditions can make back pain worse. °· Wear flat heel shoes with cushioned soles. °· Avoid sudden movements. °· Use both shoulder straps when carrying a backpack. °· Do not use any tobacco products, including cigarettes, chewing tobacco, or electronic cigarettes. If you need help quitting, ask your health care provider. °  °This information is not intended to replace advice given to you by your health care provider. Make sure you discuss any questions you have with your health care provider. °  °Document Released: 08/05/2004 Document Revised: 11/12/2014 Document Reviewed: 07/02/2014 °Elsevier Interactive Patient Education ©2016 [employer] ° °Lumbosacral Strain °Lumbosacral strain is a strain of any of the parts that make up your lumbosacral vertebrae. Your lumbosacral vertebrae are the bones that make up the lower third of your backbone. Your lumbosacral vertebrae are held together by muscles and tough, fibrous tissue (ligaments).    °CAUSES  °A sudden blow to your back can cause lumbosacral strain. Also, anything that causes an excessive stretch of the muscles in the low back can cause this strain. This is typically seen when people exert themselves strenuously, fall, lift heavy objects, bend, or crouch repeatedly. °RISK FACTORS °· Physically demanding work. °· Participation in pushing or pulling sports or sports that require a sudden twist of the back (tennis, golf, baseball). °· Weight lifting. °· Excessive lower back curvature. °· Forward-tilted pelvis. °· Weak back or abdominal muscles or both. °· Tight hamstrings. °SIGNS AND SYMPTOMS  °Lumbosacral strain may cause pain in the area of your injury or pain that moves (radiates) down your leg.  °DIAGNOSIS °Your health care provider can often diagnose lumbosacral strain through a physical exam. In some cases, you may need tests such as X-ray exams.  °TREATMENT  °Treatment for your lower back injury depends on many factors that   your clinician will have to evaluate. However, most treatment will include the use of anti-inflammatory medicines. °HOME CARE INSTRUCTIONS  °· Avoid hard physical activities (tennis, racquetball, waterskiing) if you are not in proper physical condition for it. This may aggravate or create problems. °· If you have a back problem, avoid sports requiring sudden body movements. Swimming and walking are generally safer activities. °· Maintain good posture. °· Maintain a healthy weight. °· For acute conditions, you may put ice on the injured area. °¨ Put ice in a plastic bag. °¨ Place a towel between your skin and the bag. °¨ Leave the ice on for 20 minutes, 2-3 times a day. °· When the low back starts healing, stretching and strengthening exercises may be recommended. °SEEK MEDICAL CARE IF: °· Your back pain is getting worse. °· You experience severe back pain not relieved with medicines. °SEEK IMMEDIATE MEDICAL CARE IF:  °· You have numbness, tingling, weakness, or problems  with the use of your arms or legs. °· There is a change in bowel or bladder control. °· You have increasing pain in any area of the body, including your belly (abdomen). °· You notice shortness of breath, dizziness, or feel faint. °· You feel sick to your stomach (nauseous), are throwing up (vomiting), or become sweaty. °· You notice discoloration of your toes or legs, or your feet get very cold. °MAKE SURE YOU:  °· Understand these instructions. °· Will watch your condition. °· Will get help right away if you are not doing well or get worse. °  °This information is not intended to replace advice given to you by your health care provider. Make sure you discuss any questions you have with your health care provider. °  °Document Released: 04/07/2005 Document Revised: 07/19/2014 Document Reviewed: 02/14/2013 °Elsevier Interactive Patient Education ©2016 [employer] Inc[employer] ° °

## 2016-04-24 ENCOUNTER — Encounter: Payer: Self-pay | Admitting: *Deleted

## 2016-04-24 ENCOUNTER — Ambulatory Visit
Admission: EM | Admit: 2016-04-24 | Discharge: 2016-04-24 | Disposition: A | Discharge status: Home / Self Care | Payer: Medicare Other | Attending: Family Medicine | Admitting: Family Medicine

## 2016-04-24 ENCOUNTER — Encounter: Payer: Self-pay | Admitting: Urgent Care

## 2016-04-24 ENCOUNTER — Emergency Department
Admission: EM | Admit: 2016-04-24 | Discharge: 2016-04-24 | Disposition: A | Discharge status: Home / Self Care | Payer: Medicare Other | Attending: Emergency Medicine | Admitting: Emergency Medicine

## 2016-04-24 DIAGNOSIS — F17210 Nicotine dependence, cigarettes, uncomplicated: Secondary | ICD-10-CM | POA: Insufficient documentation

## 2016-04-24 DIAGNOSIS — S60512A Abrasion of left hand, initial encounter: Secondary | ICD-10-CM

## 2016-04-24 DIAGNOSIS — Z23 Encounter for immunization: Secondary | ICD-10-CM

## 2016-04-24 DIAGNOSIS — S60511A Abrasion of right hand, initial encounter: Secondary | ICD-10-CM | POA: Diagnosis not present

## 2016-04-24 DIAGNOSIS — J45909 Unspecified asthma, uncomplicated: Secondary | ICD-10-CM | POA: Diagnosis not present

## 2016-04-24 DIAGNOSIS — R55 Syncope and collapse: Secondary | ICD-10-CM | POA: Diagnosis not present

## 2016-04-24 DIAGNOSIS — Z79899 Other long term (current) drug therapy: Secondary | ICD-10-CM | POA: Diagnosis not present

## 2016-04-24 DIAGNOSIS — F419 Anxiety disorder, unspecified: Secondary | ICD-10-CM | POA: Diagnosis present

## 2016-04-24 LAB — GLUCOSE, CAPILLARY: GLUCOSE-CAPILLARY: 138 mg/dL — AB (ref 65–99)

## 2016-04-24 MED ORDER — TETANUS-DIPHTH-ACELL PERTUSSIS 5-2.5-18.5 LF-MCG/0.5 IM SUSP
0.5000 mL | Freq: Once | INTRAMUSCULAR | Status: AC
  Administered 2016-04-24: 0.5 mL via INTRAMUSCULAR

## 2016-04-24 NOTE — ED Provider Notes (Signed)
[facility] Emergency Department Provider Note   ____________________________________________    I have reviewed the triage vital signs and the nursing notes.   HISTORY  Chief Complaint Allergic Reaction and Anxiety     HPI Derek Spencer is a 25 y.o. male Who presents with complaints of brief period of dizziness. Patient reports he had a tetanus shot today but that it was "messed up and he had 2have another one in the other arm ". He thinks this may have had something to do with the fact that he became mildly lightheaded and broke out in a sweat approximately 2 hours prior to arrival. He felt that he may be hypoglycemic so he went to [employer] and ate something which did seem to help.Currently feels well but thought he needed to be checked out   Past Medical History:  Diagnosis Date  . Asthma     There are no active problems to display for this patient.   Past Surgical History:  Procedure Laterality Date  . NECK SURGERY      Prior to Admission medications   Medication Sig Start Date End Date Taking? Authorizing Provider  ibuprofen (ADVIL,MOTRIN) 800 MG tablet Take 1 tablet (800 mg total) by mouth every 8 (eight) hours as needed. 06/13/15   Joni Reining, PA-C  naproxen (NAPROSYN) 500 MG tablet Take 1 tablet (500 mg total) by mouth 2 (two) times daily with a meal. 01/29/16   Lutricia Feil, PA-C  promethazine-dextromethorphan (PROMETHAZINE-DM) 6.25-15 MG/5ML syrup Take 5 mLs by mouth 4 (four) times daily as needed for cough. 06/13/15   Joni Reining, PA-C  ranitidine (ZANTAC) 75 MG tablet Take 1 tablet (75 mg total) by mouth 2 (two) times daily. 01/01/15   Myrna Blazer, MD  tiZANidine (ZANAFLEX) 4 MG tablet Take 1 tablet (4 mg total) by mouth every 6 (six) hours as needed for muscle spasms. 01/29/16   Lutricia Feil, PA-C     Allergies Review of patient's allergies indicates no known allergies.  No family history on  file.  Social History Social History  Substance Use Topics  . Smoking status: Current Every Day Smoker    Packs/day: 0.20    Types: Cigarettes  . Smokeless tobacco: Never Used  . Alcohol use 0.0 oz/week     Comment: social    Review of Systems  Constitutional: No fever/chills. No dizziness currently   CV: No palpitations or chest pain Respiratory: No shortness of breath Gastrointestinal: No abdominal pain.  No nausea, no vomiting.    Musculoskeletal: Negative for back pain. Skin: Negative for rash. Neurological: Negative for headaches     ____________________________________________   PHYSICAL EXAM:  VITAL SIGNS: ED Triage Vitals  Enc Vitals Group     BP 04/24/16 2210 (!) 161/90     Pulse Rate 04/24/16 2210 88     Resp 04/24/16 2210 18     Temp 04/24/16 2210 98 F (36.7 C)     Temp Source 04/24/16 2210 Oral     SpO2 04/24/16 2210 99 %     Weight 04/24/16 2210 195 lb (88.5 kg)     Height 04/24/16 2210 6\' 1"  (1.854 m)     Head Circumference --      Peak Flow --      Pain Score 04/24/16 2211 0     Pain Loc --      Pain Edu? --      Excl. in GC? --  Constitutional: Alert and oriented. No acute distress. Pleasant and interactive Eyes: Conjunctivae are normal.   Nose: No congestion/rhinnorhea. Mouth/Throat: Mucous membranes are moist.   Cardiovascular: Normal rate, regular rhythm.  Respiratory: Normal respiratory effort.  No retractions.clear to auscultation bilaterally Genitourinary: deferred Musculoskeletal: No lower extremity tenderness nor edema.   Neurologic:  Normal speech and language. No gross focal neurologic deficits are appreciated.   Skin:  Skin is warm, dry and intact. No rash noted.   ____________________________________________   LABS (all labs ordered are listed, but only abnormal results are displayed)  Labs Reviewed  GLUCOSE, CAPILLARY - Abnormal; Notable for the following:       Result Value   Glucose-Capillary 138 (*)    All  other components within normal limits  CBG MONITORING, ED   ____________________________________________  EKG   ____________________________________________  RADIOLOGY  None ____________________________________________   PROCEDURES  Procedure(s) performed: No    Critical Care performed: No ____________________________________________   INITIAL IMPRESSION / ASSESSMENT AND PLAN / ED COURSE  Pertinent labs & imaging results that were available during my care of the patient were reviewed by me and considered in my medical decision making (see chart for details).  Patient well-appearing and asymptomatic. He has no complaints. No history of palpitations chest pain shortness of breath. I suspect vasovagal episode. Blood sugar is unremarkable. Vitals are reassuring.Recommended supportive care.   ____________________________________________   FINAL CLINICAL IMPRESSION(S) / ED DIAGNOSES  Final diagnoses:  Vasovagal near syncope      NEW MEDICATIONS STARTED DURING THIS VISIT:  Discharge Medication List as of 04/24/2016 10:42 PM       Note:  This document was prepared using Dragon voice recognition software and may include unintentional dictation errors.    Jene Everyobert Charlena Haub, MD 04/24/16 [phone]

## 2016-04-24 NOTE — ED Triage Notes (Signed)
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 Concerned about how current his tetanus immunization is.

## 2016-04-24 NOTE — ED Triage Notes (Addendum)
Patient presents with reports of a possible medication reaction. Patient advises that he was given 2 tetanus shots today, citing the fact that "they messed up on the first one". Patient presents tonight with reports of being tired, irritable, having intermittent diaphoresis, and experiencing periods of his heart racing. Patient's main symptom is that he has been really hungry since the injection. Denies substance use today.

## 2016-05-10 NOTE — ED Provider Notes (Signed)
[facility]    CSN: [id number] Arrival date & time: 04/24/16  1248     History   Chief Complaint Chief Complaint  Patient presents with  . Hand Injury    HPI Derek Planaron A Spencer is a 25 y.o. male.   25 yo male with a c/o some cuts to his hands. 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 Concerned about how current his tetanus immunization is.    Hand Injury    Past Medical History:  Diagnosis Date  . Asthma     There are no active problems to display for this patient.   Past Surgical History:  Procedure Laterality Date  . NECK SURGERY         Home Medications    Prior to Admission medications   Medication Sig Start Date End Date Taking? Authorizing Provider  ibuprofen (ADVIL,MOTRIN) 800 MG tablet Take 1 tablet (800 mg total) by mouth every 8 (eight) hours as needed. 06/13/15   Joni Reiningonald K Smith, PA-C  naproxen (NAPROSYN) 500 MG tablet Take 1 tablet (500 mg total) by mouth 2 (two) times daily with a meal. 01/29/16   Lutricia FeilWilliam P Roemer, PA-C  promethazine-dextromethorphan (PROMETHAZINE-DM) 6.25-15 MG/5ML syrup Take 5 mLs by mouth 4 (four) times daily as needed for cough. 06/13/15   Joni Reiningonald K Smith, PA-C  ranitidine (ZANTAC) 75 MG tablet Take 1 tablet (75 mg total) by mouth 2 (two) times daily. 01/01/15   Myrna Blazeravid Matthew Schaevitz, MD  tiZANidine (ZANAFLEX) 4 MG tablet Take 1 tablet (4 mg total) by mouth every 6 (six) hours as needed for muscle spasms. 01/29/16   Lutricia FeilWilliam P Roemer, PA-C    Family History History reviewed. No pertinent family history.  Social History Social History  Substance Use Topics  . Smoking status: Current Every Day Smoker    Packs/day: 0.20    Types: Cigarettes  . Smokeless tobacco: Never Used  . Alcohol use 0.0 oz/week     Comment: social     Allergies   Review of patient's allergies indicates no known allergies.   Review of Systems Review of Systems   Physical Exam Triage Vital Signs ED Triage  Vitals  Enc Vitals Group     BP 04/24/16 1334 (!) 148/87     Pulse Rate 04/24/16 1334 74     Resp 04/24/16 1334 16     Temp 04/24/16 1334 97.7 F (36.5 C)     Temp Source 04/24/16 1334 Oral     SpO2 04/24/16 1334 100 %     Weight 04/24/16 1336 195 lb (88.5 kg)     Height 04/24/16 1336 6\' 1"  (1.854 m)     Head Circumference --      Peak Flow --      Pain Score --      Pain Loc --      Pain Edu? --      Excl. in GC? --    No data found.   Updated Vital Signs BP (!) 148/87 (BP Location: Right Arm)   Pulse 74   Temp 97.7 F (36.5 C) (Oral)   Resp 16   Ht 6\' 1"  (1.854 m)   Wt 195 lb (88.5 kg)   SpO2 100%   BMI 25.73 kg/m   Visual Acuity Right Eye Distance:   Left Eye Distance:   Bilateral Distance:    Right Eye Near:   Left Eye Near:    Bilateral Near:  Physical Exam  Constitutional: He appears well-developed and well-nourished. No distress.  Skin: He is not diaphoretic.  Several very superficial abrasions to dorsum of hands   Nursing note and vitals reviewed.    UC Treatments / Results  Labs (all labs ordered are listed, but only abnormal results are displayed) Labs Reviewed - No data to display  EKG  EKG Interpretation None       Radiology No results found.  Procedures Procedures (including critical care time)  Medications Ordered in UC Medications  Tdap (BOOSTRIX) injection 0.5 mL (0.5 mLs Intramuscular Given 04/24/16 1347)     Initial Impression / Assessment and Plan / UC Course  I have reviewed the triage vital signs and the nursing notes.  Pertinent labs & imaging results that were available during my care of the patient were reviewed by me and considered in my medical decision making (see chart for details).  Clinical Course      Final Clinical Impressions(s) / UC Diagnoses   Final diagnoses:  Abrasion of right hand, initial encounter  Abrasion of left hand, initial encounter    New Prescriptions Discharge Medication  List as of 04/24/2016  2:21 PM     1. diagnosis reviewed with patient 2. Tetanus vaccine given 3. Supportive treatment with topical antibiotic ointment for prevention 4.Follow-up prn   Payton Mccallumrlando Vaani Morren, MD 05/10/16 2002

## 2016-09-02 ENCOUNTER — Encounter: Payer: Self-pay | Admitting: Emergency Medicine

## 2016-09-02 DIAGNOSIS — F17210 Nicotine dependence, cigarettes, uncomplicated: Secondary | ICD-10-CM | POA: Insufficient documentation

## 2016-09-02 DIAGNOSIS — Z79899 Other long term (current) drug therapy: Secondary | ICD-10-CM | POA: Diagnosis not present

## 2016-09-02 DIAGNOSIS — R079 Chest pain, unspecified: Secondary | ICD-10-CM | POA: Diagnosis not present

## 2016-09-02 DIAGNOSIS — J45909 Unspecified asthma, uncomplicated: Secondary | ICD-10-CM | POA: Diagnosis not present

## 2016-09-02 DIAGNOSIS — Z5321 Procedure and treatment not carried out due to patient leaving prior to being seen by health care provider: Secondary | ICD-10-CM | POA: Insufficient documentation

## 2016-09-02 LAB — CBC
HCT: 47.6 % (ref 40.0–52.0)
Hemoglobin: 16.2 g/dL (ref 13.0–18.0)
MCH: 28.2 pg (ref 26.0–34.0)
MCHC: 34 g/dL (ref 32.0–36.0)
MCV: 82.8 fL (ref 80.0–100.0)
Platelets: 260 10*3/uL (ref 150–440)
RBC: 5.75 MIL/uL (ref 4.40–5.90)
RDW: 14 % (ref 11.5–14.5)
WBC: 12.5 10*3/uL — ABNORMAL HIGH (ref 3.8–10.6)

## 2016-09-02 NOTE — ED Triage Notes (Addendum)
Patient ambulatory to triage with steady gait, without difficulty or distress noted; pt reports intermittent sharp pain to middle of chest for last ; denies radiating pain, denies accomp symptoms; st hx of same and seen here but doesn't remember dx

## 2016-09-03 ENCOUNTER — Emergency Department
Admission: EM | Admit: 2016-09-03 | Discharge: 2016-09-03 | Disposition: A | Discharge status: Home / Self Care | Payer: Medicare Other | Attending: Emergency Medicine | Admitting: Emergency Medicine

## 2016-09-03 ENCOUNTER — Other Ambulatory Visit: Payer: Medicare Other

## 2016-09-03 ENCOUNTER — Emergency Department: Payer: Medicare Other

## 2016-09-03 DIAGNOSIS — R079 Chest pain, unspecified: Secondary | ICD-10-CM | POA: Diagnosis not present

## 2016-09-03 LAB — BASIC METABOLIC PANEL
Anion gap: 9 (ref 5–15)
BUN: 10 mg/dL (ref 6–20)
CALCIUM: 9.3 mg/dL (ref 8.9–10.3)
CO2: 27 mmol/L (ref 22–32)
Chloride: 102 mmol/L (ref 101–111)
Creatinine, Ser: 0.95 mg/dL (ref 0.61–1.24)
GFR calc Af Amer: >60 mL/min (ref >60)
GLUCOSE: 120 mg/dL — AB (ref 65–99)
POTASSIUM: 3.5 mmol/L (ref 3.5–5.1)
Sodium: 138 mmol/L (ref 135–145)

## 2016-09-03 LAB — TROPONIN I: Troponin I: <0.03 ng/mL (ref <0.03)

## 2016-09-03 NOTE — ED Notes (Signed)
EDP entered room to assess pt, pt not present.  Gown found on bench in room.  No ED staff recalls seeing pt leave.

## 2017-06-06 ENCOUNTER — Other Ambulatory Visit: Payer: Self-pay

## 2017-06-06 ENCOUNTER — Ambulatory Visit
Admission: EM | Admit: 2017-06-06 | Discharge: 2017-06-06 | Disposition: A | Discharge status: Home / Self Care | Payer: Medicaid Other | Attending: Family Medicine | Admitting: Family Medicine

## 2017-06-06 ENCOUNTER — Encounter: Payer: Self-pay | Admitting: Emergency Medicine

## 2017-06-06 DIAGNOSIS — R0981 Nasal congestion: Secondary | ICD-10-CM

## 2017-06-06 DIAGNOSIS — R51 Headache: Secondary | ICD-10-CM | POA: Diagnosis not present

## 2017-06-06 DIAGNOSIS — J309 Allergic rhinitis, unspecified: Secondary | ICD-10-CM

## 2017-06-06 DIAGNOSIS — R0982 Postnasal drip: Secondary | ICD-10-CM

## 2017-06-06 MED ORDER — CETIRIZINE HCL 10 MG PO TABS: 10.0000 mg | ORAL_TABLET | Freq: Every day | ORAL | 0 refills | Status: DC

## 2017-06-06 MED ORDER — FLUTICASONE PROPIONATE 50 MCG/ACT NA SUSP: 2.0000 | Freq: Every day | NASAL | 0 refills | Status: DC

## 2017-06-06 NOTE — Discharge Instructions (Signed)
Medication as prescribed.  Take care  Dr. Laylee Schooley  

## 2017-06-06 NOTE — ED Provider Notes (Signed)
[facility]    CSN: [id number] Arrival date & time: 06/06/17  1801     History   Chief Complaint Chief Complaint  Patient presents with  . sinus congestion   HPI  26 year old male presents with congestion and postnasal drip.  Patient reports a 2-day history of congestion postnasal drip.  Associated headache.  Symptoms are severe.  He reports chills but no fever.  He has taken ibuprofen with improvement in his headache but without resolution of his other symptoms.  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  He has been blowing a lot of leaves lately and has not been asked.  No other associated symptoms.  No other complaints at this time.  Past Medical History:  Diagnosis Date  . Asthma    Past Surgical History:  Procedure Laterality Date  . NECK SURGERY     Home Medications    Prior to Admission medications   Medication Sig Start Date End Date Taking? Authorizing Provider  cetirizine (ZYRTEC) 10 MG tablet Take 1 tablet (10 mg total) by mouth daily. 06/06/17   Tommie Samsook, Whyatt Klinger G, DO  fluticasone (FLONASE) 50 MCG/ACT nasal spray Place 2 sprays into both nostrils daily. 06/06/17   Tommie Samsook, Dickie Cloe G, DO    Family History Family History  Problem Relation Age of Onset  . Osteoarthritis Mother   . Diabetes Father     Social History Social History   Tobacco Use  . Smoking status: Current Every Day Smoker    Packs/day: 0.20    Types: Cigarettes  . Smokeless tobacco: Never Used  Substance Use Topics  . Alcohol use: Yes    Alcohol/week: 2.4 oz    Types: 4 Cans of beer per week    Comment: social  . Drug use: No     Allergies   Patient has no known allergies.   Review of Systems Review of Systems  Constitutional: Positive for chills. Negative for fever.  HENT: Positive for congestion and postnasal drip.   Neurological: Positive for headaches.   Physical Exam Triage Vital Signs ED Triage Vitals  Enc Vitals Group     BP 06/06/17 1839 (!) 150/86       Pulse Rate 06/06/17 1839 91     Resp 06/06/17 1839 16     Temp 06/06/17 1839 98.6 F (37 C)     Temp Source 06/06/17 1839 Oral     SpO2 06/06/17 1839 100 %     Weight 06/06/17 1838 195 lb (88.5 kg)     Height 06/06/17 1838 6\' 1"  (1.854 m)     Head Circumference --      Peak Flow --      Pain Score 06/06/17 1838 0     Pain Loc --      Pain Edu? --      Excl. in GC? --    No data found.  Updated Vital Signs BP (!) 150/86 (BP Location: Left Arm)   Pulse 91   Temp 98.6 F (37 C) (Oral)   Resp 16   Ht 6\' 1"  (1.854 m)   Wt 195 lb (88.5 kg)   SpO2 100%   BMI 25.73 kg/m   Physical Exam  Constitutional: He is oriented to person, place, and time. He appears well-developed. No distress.  HENT:  Head: Normocephalic and atraumatic.  Mouth/Throat: Oropharynx is clear and moist.  Edematous turbinates, left. No nasal discharge. Oropharynx -cobblestoning.  Eyes: Conjunctivae are normal. Right eye exhibits no discharge. Left  eye exhibits no discharge.  Cardiovascular: Normal rate and regular rhythm.  No murmur heard. Pulmonary/Chest: Effort normal and breath sounds normal. He has no wheezes.  Neurological: He is alert and oriented to person, place, and time.  Skin: Skin is warm. No rash noted.  Psychiatric: He has a normal mood and affect. His behavior is normal.  Vitals reviewed.  UC Treatments / Results  Labs (all labs ordered are listed, but only abnormal results are displayed) Labs Reviewed - No data to display  EKG  EKG Interpretation None       Radiology No results found.  Procedures Procedures (including critical care time)  Medications Ordered in UC Medications - No data to display   Initial Impression / Assessment and Plan / UC Course  I have reviewed the triage vital signs and the nursing notes.  Pertinent labs & imaging results that were available during my care of the patient were reviewed by me and considered in my medical decision making (see  chart for details).     26 year old male presents with congestion and postnasal drip.  Allergic in nature.  Advised him to use masks when working.  Advised Flonase and Zyrtec.  Final Clinical Impressions(s) / UC Diagnoses   Final diagnoses:  Postnasal drip  Allergic rhinitis, unspecified seasonality, unspecified trigger    ED Discharge Orders        Ordered    fluticasone (FLONASE) 50 MCG/ACT nasal spray  Daily     06/06/17 1927    cetirizine (ZYRTEC) 10 MG tablet  Daily     06/06/17 1927     Controlled Substance Prescriptions [place] Controlled Substance Registry consulted? Not Applicable   Tommie SamsCook, Rayelle Armor G, DO 06/06/17 [id number]

## 2017-06-06 NOTE — ED Triage Notes (Signed)
Patient in tonight with 3 day history of sinus congestion, post nasal drip and headache. Patient has had chills, but hasn't taken temperature. Patient has not tried any OTC medication except Ibuprofen.

## 2017-06-11 ENCOUNTER — Other Ambulatory Visit: Payer: Self-pay

## 2017-06-11 ENCOUNTER — Emergency Department
Admission: EM | Admit: 2017-06-11 | Discharge: 2017-06-11 | Disposition: A | Discharge status: Home / Self Care | Payer: Medicaid Other | Attending: Emergency Medicine | Admitting: Emergency Medicine

## 2017-06-11 ENCOUNTER — Encounter: Payer: Self-pay | Admitting: Emergency Medicine

## 2017-06-11 DIAGNOSIS — J029 Acute pharyngitis, unspecified: Secondary | ICD-10-CM | POA: Diagnosis not present

## 2017-06-11 DIAGNOSIS — J069 Acute upper respiratory infection, unspecified: Secondary | ICD-10-CM | POA: Diagnosis not present

## 2017-06-11 DIAGNOSIS — Z79899 Other long term (current) drug therapy: Secondary | ICD-10-CM | POA: Insufficient documentation

## 2017-06-11 DIAGNOSIS — F17210 Nicotine dependence, cigarettes, uncomplicated: Secondary | ICD-10-CM | POA: Diagnosis not present

## 2017-06-11 DIAGNOSIS — R0981 Nasal congestion: Secondary | ICD-10-CM | POA: Diagnosis not present

## 2017-06-11 DIAGNOSIS — R0982 Postnasal drip: Secondary | ICD-10-CM | POA: Diagnosis not present

## 2017-06-11 DIAGNOSIS — J45909 Unspecified asthma, uncomplicated: Secondary | ICD-10-CM | POA: Insufficient documentation

## 2017-06-11 DIAGNOSIS — R05 Cough: Secondary | ICD-10-CM | POA: Diagnosis present

## 2017-06-11 DIAGNOSIS — B9789 Other viral agents as the cause of diseases classified elsewhere: Secondary | ICD-10-CM | POA: Diagnosis not present

## 2017-06-11 MED ORDER — BENZONATATE 100 MG PO CAPS
200.0000 mg | ORAL_CAPSULE | Freq: Once | ORAL | Status: AC
  Administered 2017-06-11: 200 mg via ORAL
  Filled 2017-06-11: qty 2

## 2017-06-11 MED ORDER — MAGIC MOUTHWASH W/LIDOCAINE: 5.0000 mL | Freq: Four times a day (QID) | ORAL | 0 refills | Status: DC

## 2017-06-11 MED ORDER — LIDOCAINE VISCOUS 2 % MT SOLN
15.0000 mL | Freq: Once | OROMUCOSAL | Status: AC
  Administered 2017-06-11: 15 mL via OROMUCOSAL
  Filled 2017-06-11: qty 15

## 2017-06-11 MED ORDER — DIPHENHYDRAMINE HCL 12.5 MG/5ML PO ELIX
25.0000 mg | ORAL_SOLUTION | Freq: Once | ORAL | Status: AC
  Administered 2017-06-11: 25 mg via ORAL
  Filled 2017-06-11: qty 10

## 2017-06-11 MED ORDER — BENZONATATE 100 MG PO CAPS: 200.0000 mg | ORAL_CAPSULE | Freq: Three times a day (TID) | ORAL | 0 refills | Status: AC | PRN

## 2017-06-11 MED ORDER — FEXOFENADINE-PSEUDOEPHED ER 60-120 MG PO TB12: 1.0000 | ORAL_TABLET | Freq: Two times a day (BID) | ORAL | 0 refills | Status: DC

## 2017-06-11 NOTE — ED Notes (Signed)
Pt discharged to home.  Family member driving.  Discharge instructions reviewed.  Verbalized understanding.  No questions or concerns at this time.  Teach back verified.  Pt in NAD.  No items left in ED.   

## 2017-06-11 NOTE — ED Provider Notes (Signed)
[facility] Emergency Department Provider Note   ____________________________________________   First MD Initiated Contact with Patient 06/11/17 2101     (approximate)  I have reviewed the triage vital signs and the nursing notes.   HISTORY  Chief Complaint URI    HPI Fuller Planaron A Saltos is a 26 y.o. male patient complaining of cough with nasal and chest congestion for 4 days. Patient also states sore throat secondary to postnasal drainage. Patient denies fever, nausea, vomiting, or diarrhea. No palliative measures for complaint.Patient rates his pain discomfort as a 5/10.   Past Medical History:  Diagnosis Date  . Asthma     There are no active problems to display for this patient.   Past Surgical History:  Procedure Laterality Date  . NECK SURGERY      Prior to Admission medications   Medication Sig Start Date End Date Taking? Authorizing Provider  benzonatate (TESSALON PERLES) 100 MG capsule Take 2 capsules (200 mg total) by mouth 3 (three) times daily as needed for cough. 06/11/17 06/11/18  Joni ReiningSmith, Ronald K, PA-C  cetirizine (ZYRTEC) 10 MG tablet Take 1 tablet (10 mg total) by mouth daily. 06/06/17   Tommie Samsook, Jayce G, DO  fexofenadine-pseudoephedrine (ALLEGRA-D) 60-120 MG 12 hr tablet Take 1 tablet by mouth 2 (two) times daily. 06/11/17   Joni ReiningSmith, Ronald K, PA-C  fluticasone (FLONASE) 50 MCG/ACT nasal spray Place 2 sprays into both nostrils daily. 06/06/17   Tommie Samsook, Jayce G, DO  magic mouthwash w/lidocaine SOLN Take 5 mLs by mouth 4 (four) times daily. 06/11/17   Joni ReiningSmith, Ronald K, PA-C    Allergies Patient has no known allergies.  Family History  Problem Relation Age of Onset  . Osteoarthritis Mother   . Diabetes Father     Social History Social History   Tobacco Use  . Smoking status: Current Every Day Smoker    Packs/day: 0.20    Types: Cigarettes  . Smokeless tobacco: Never Used  Substance Use Topics  . Alcohol use: Yes    Alcohol/week:  2.4 oz    Types: 4 Cans of beer per week    Comment: social  . Drug use: No    Review of Systems Constitutional: No fever/chills Eyes: No visual changes. ENT: Sore throat and nasal congestion Cardiovascular: Denies chest pain. Respiratory: Denies shortness of breath. Nonproductive cough Gastrointestinal: No abdominal pain.  No nausea, no vomiting.  No diarrhea.  No constipation. Genitourinary: Negative for dysuria. Musculoskeletal: Negative for back pain. Skin: Negative for rash. Neurological: Negative for headaches, focal weakness or numbness.   ____________________________________________   PHYSICAL EXAM:  VITAL SIGNS: ED Triage Vitals  Enc Vitals Group     BP 06/11/17 2055 (!) 148/89     Pulse Rate 06/11/17 2055 88     Resp 06/11/17 2055 18     Temp 06/11/17 2055 98.1 F (36.7 C)     Temp Source 06/11/17 2055 Oral     SpO2 06/11/17 2055 97 %     Weight 06/11/17 2056 190 lb (86.2 kg)     Height 06/11/17 2056 6\' 1"  (1.854 m)     Head Circumference --      Peak Flow --      Pain Score 06/11/17 2054 5     Pain Loc --      Pain Edu? --      Excl. in GC? --    Constitutional: Alert and oriented. Well appearing and in no acute distress. Nose: Edematous nasal turbinates  clear rhinorrhea Mouth/Throat: Mucous membranes are moist.  Oropharynx non-erythematous. Postnasal drainage Neck: No stridor.  Hematological/Lymphatic/Immunilogical: No cervical lymphadenopathy. Cardiovascular: Normal rate, regular rhythm. Grossly normal heart sounds.  Good peripheral circulation. Respiratory: Normal respiratory effort.  No retractions. Lungs CTAB. Gastrointestinal: Soft and nontender. No distention. No abdominal bruits. No CVA tenderness. Musculoskeletal: No lower extremity tenderness nor edema.  No joint effusions. Neurologic:  Normal speech and language. No gross focal neurologic deficits are appreciated. No gait instability. Skin:  Skin is warm, dry and intact. No rash  noted. Psychiatric: Mood and affect are normal. Speech and behavior are normal.  ____________________________________________   LABS (all labs ordered are listed, but only abnormal results are displayed)  Labs Reviewed - No data to display ____________________________________________  EKG   ____________________________________________  RADIOLOGY  No results found.  ____________________________________________   PROCEDURES  Procedure(s) performed: None  Procedures  Critical Care performed: No  ____________________________________________   INITIAL IMPRESSION / ASSESSMENT AND PLAN / ED COURSE  As part of my medical decision making, I reviewed the following data within the electronic MEDICAL RECORD NUMBER    Viral upper respiratory infection with cough. Patient given discharge care instructions. Patient advised take medication as directed. Patient advised to [facility] if condition persists.      ____________________________________________   FINAL CLINICAL IMPRESSION(S) / ED DIAGNOSES  Final diagnoses:  Viral URI with cough     ED Discharge Orders        Ordered    benzonatate (TESSALON PERLES) 100 MG capsule  3 times daily PRN     06/11/17 2108    fexofenadine-pseudoephedrine (ALLEGRA-D) 60-120 MG 12 hr tablet  2 times daily     06/11/17 2108    magic mouthwash w/lidocaine SOLN  4 times daily     06/11/17 2108       Note:  This document was prepared using Dragon voice recognition software and may include unintentional dictation errors.    Joni ReiningSmith, Ronald K, PA-C 06/11/17 2112    Joni ReiningSmith, Ronald K, PA-C 06/11/17 2119    Phineas SemenGoodman, Graydon, MD 06/11/17 [phone]

## 2017-06-11 NOTE — ED Notes (Signed)
See triage note.  Pt ambulatory from triage, in NAD.  Pt having URI-like symptoms for 4 days.  Denies fever.

## 2017-06-11 NOTE — ED Triage Notes (Signed)
Pt arrives ambulatory to ED with c/o cough and congestion x 4 days. Pt is in NAD.

## 2017-10-04 ENCOUNTER — Ambulatory Visit
Admission: EM | Admit: 2017-10-04 | Discharge: 2017-10-04 | Disposition: A | Discharge status: Home / Self Care | Payer: Medicaid Other | Attending: Family Medicine | Admitting: Family Medicine

## 2017-10-04 ENCOUNTER — Other Ambulatory Visit: Payer: Self-pay

## 2017-10-04 DIAGNOSIS — F17210 Nicotine dependence, cigarettes, uncomplicated: Secondary | ICD-10-CM | POA: Diagnosis not present

## 2017-10-04 DIAGNOSIS — R300 Dysuria: Secondary | ICD-10-CM | POA: Diagnosis not present

## 2017-10-04 DIAGNOSIS — N3001 Acute cystitis with hematuria: Secondary | ICD-10-CM | POA: Diagnosis not present

## 2017-10-04 DIAGNOSIS — R319 Hematuria, unspecified: Secondary | ICD-10-CM | POA: Diagnosis present

## 2017-10-04 LAB — BASIC METABOLIC PANEL
ANION GAP: 9 (ref 5–15)
BUN: 14 mg/dL (ref 6–20)
CHLORIDE: 100 mmol/L — AB (ref 101–111)
CO2: 27 mmol/L (ref 22–32)
Calcium: 9.1 mg/dL (ref 8.9–10.3)
Creatinine, Ser: 0.73 mg/dL (ref 0.61–1.24)
GFR calc Af Amer: >60 mL/min (ref >60)
GLUCOSE: 124 mg/dL — AB (ref 65–99)
Potassium: 3.5 mmol/L (ref 3.5–5.1)
Sodium: 136 mmol/L (ref 135–145)

## 2017-10-04 LAB — URINALYSIS, COMPLETE (UACMP) WITH MICROSCOPIC
GLUCOSE, UA: 100 mg/dL — AB
KETONES UR: 15 mg/dL — AB
LEUKOCYTES UA: NEGATIVE
Nitrite: POSITIVE — AB
PH: 8.5 — AB (ref 5.0–8.0)
Protein, ur: >300 mg/dL — AB
SQUAMOUS EPITHELIAL / LPF: NONE SEEN
Specific Gravity, Urine: 1.02 (ref 1.005–1.030)

## 2017-10-04 LAB — CHLAMYDIA/NGC RT PCR (ARMC ONLY)
Chlamydia Tr: NOT DETECTED
N GONORRHOEAE: NOT DETECTED

## 2017-10-04 MED ORDER — HYDROCODONE-ACETAMINOPHEN 5-325 MG PO TABS: ORAL_TABLET | ORAL | 0 refills | Status: DC

## 2017-10-04 MED ORDER — SULFAMETHOXAZOLE-TRIMETHOPRIM 800-160 MG PO TABS: 1.0000 | ORAL_TABLET | Freq: Two times a day (BID) | ORAL | 0 refills | Status: DC

## 2017-10-04 NOTE — ED Provider Notes (Signed)
[facility]    CSN: [id number] Arrival date & time: 10/04/17  1849     History   Chief Complaint Chief Complaint  Patient presents with  . Hematuria    HPI Derek Spencer is a 27 y.o. male.   27 yo male with a c/o burning at the end of urination, urinary frequency and dark colored urine that started suddenly today. Patient denies any back pain, fevers, chills, penile discharge. Currently no pain. States he is sexually active with a new partner.   The history is provided by the patient.  Dysuria  This is a new problem.    Past Medical History:  Diagnosis Date  . Asthma     There are no active problems to display for this patient.   Past Surgical History:  Procedure Laterality Date  . NECK SURGERY         Home Medications    Prior to Admission medications   Medication Sig Start Date End Date Taking? Authorizing Provider  benzonatate (TESSALON PERLES) 100 MG capsule Take 2 capsules (200 mg total) by mouth 3 (three) times daily as needed for cough. 06/11/17 06/11/18  Joni Reining, PA-C  cetirizine (ZYRTEC) 10 MG tablet Take 1 tablet (10 mg total) by mouth daily. 06/06/17   Tommie Sams, DO  fexofenadine-pseudoephedrine (ALLEGRA-D) 60-120 MG 12 hr tablet Take 1 tablet by mouth 2 (two) times daily. 06/11/17   Joni Reining, PA-C  fluticasone (FLONASE) 50 MCG/ACT nasal spray Place 2 sprays into both nostrils daily. 06/06/17   Tommie Sams, DO  HYDROcodone-acetaminophen (NORCO/VICODIN) 5-325 MG tablet 1-2 tabs po bid 10/04/17   Payton Mccallum, MD  magic mouthwash w/lidocaine SOLN Take 5 mLs by mouth 4 (four) times daily. 06/11/17   Joni Reining, PA-C  sulfamethoxazole-trimethoprim (BACTRIM DS,SEPTRA DS) 800-160 MG tablet Take 1 tablet by mouth 2 (two) times daily. 10/04/17   Payton Mccallum, MD    Family History Family History  Problem Relation Age of Onset  . Osteoarthritis Mother   . Diabetes Father     Social History Social History   Tobacco  Use  . Smoking status: Current Every Day Smoker    Packs/day: 0.20    Types: Cigarettes  . Smokeless tobacco: Never Used  Substance Use Topics  . Alcohol use: Yes    Alcohol/week: 2.4 oz    Types: 4 Cans of beer per week    Comment: social  . Drug use: No     Allergies   Patient has no known allergies.   Review of Systems Review of Systems  Genitourinary: Positive for dysuria.     Physical Exam Triage Vital Signs ED Triage Vitals  Enc Vitals Group     BP 10/04/17 1908 (!) 145/79     Pulse Rate 10/04/17 1908 89     Resp 10/04/17 1908 18     Temp 10/04/17 1908 98.4 F (36.9 C)     Temp Source 10/04/17 1908 Oral     SpO2 10/04/17 1908 100 %     Weight 10/04/17 1907 190 lb (86.2 kg)     Height 10/04/17 1907 6\' 1"  (1.854 m)     Head Circumference --      Peak Flow --      Pain Score 10/04/17 1906 4     Pain Loc --      Pain Edu? --      Excl. in GC? --    No data found.  Updated  Vital Signs BP (!) 145/79 (BP Location: Left Arm)   Pulse 89   Temp 98.4 F (36.9 C) (Oral)   Resp 18   Ht 6\' 1"  (1.854 m)   Wt 190 lb (86.2 kg)   SpO2 100%   BMI 25.07 kg/m   Visual Acuity Right Eye Distance:   Left Eye Distance:   Bilateral Distance:    Right Eye Near:   Left Eye Near:    Bilateral Near:     Physical Exam  Constitutional: He is oriented to person, place, and time. He appears well-developed and well-nourished. No distress.  HENT:  Head: Normocephalic and atraumatic.  Cardiovascular: Normal rate, regular rhythm, normal heart sounds and intact distal pulses.  No murmur heard. Pulmonary/Chest: Effort normal and breath sounds normal. No respiratory distress. He has no wheezes. He has no rales.  Abdominal: Soft. Bowel sounds are normal. He exhibits no distension and no mass. There is no tenderness. There is no rebound and no guarding.  Neurological: He is alert and oriented to person, place, and time.  Skin: No rash noted. He is not diaphoretic.  Nursing  note and vitals reviewed.    UC Treatments / Results  Labs (all labs ordered are listed, but only abnormal results are displayed) Labs Reviewed  URINALYSIS, COMPLETE (UACMP) WITH MICROSCOPIC - Abnormal; Notable for the following components:      Result Value   Color, Urine RED (*)    APPearance CLOUDY (*)    pH 8.5 (*)    Glucose, UA 100 (*)    Hgb urine dipstick LARGE (*)    Bilirubin Urine MODERATE (*)    Ketones, ur 15 (*)    Protein, ur >300 (*)    Nitrite POSITIVE (*)    Bacteria, UA RARE (*)    All other components within normal limits  BASIC METABOLIC PANEL - Abnormal; Notable for the following components:   Chloride 100 (*)    Glucose, Bld 124 (*)    All other components within normal limits  CHLAMYDIA/NGC RT PCR ([facility] ONLY)  URINE CULTURE    EKG None Radiology No results found.  Procedures Procedures (including critical care time)  Medications Ordered in UC Medications - No data to display   Initial Impression / Assessment and Plan / UC Course  I have reviewed the triage vital signs and the nursing notes.  Pertinent labs & imaging results that were available during my care of the patient were reviewed by me and considered in my medical decision making (see chart for details).      Final Clinical Impressions(s) / UC Diagnoses   Final diagnoses:  Acute cystitis with hematuria  Dysuria    ED Discharge Orders        Ordered    sulfamethoxazole-trimethoprim (BACTRIM DS,SEPTRA DS) 800-160 MG tablet  2 times daily     10/04/17 2011    HYDROcodone-acetaminophen (NORCO/VICODIN) 5-325 MG tablet     10/04/17 2011     1. Lab results and diagnosis reviewed with patient 2. rx as per orders above; reviewed possible side effects, interactions, risks and benefits  3. Recommend supportive treatment with increased water intake  4. Follow-up prn if symptoms worsen or don't improve   Controlled Substance Prescriptions [place] Controlled Substance  Registry consulted? Not Applicable   Payton Mccallumonty, Jermar Colter, MD 10/04/17 2029

## 2017-10-04 NOTE — ED Triage Notes (Signed)
Patient complains of hematuria, lower abdominal that radiates to his right flank. Patient has abdominal tenderness and fullness. Pt. Reports that he has been going more frequently to the restroom with pain occurring at end of the stream.

## 2017-10-06 LAB — URINE CULTURE

## 2018-08-31 IMAGING — CR DG CHEST 2V
2 series · 2 of 2 positions shown · non-contrast
Comparison: Chest radiograph performed 01/01/2015

CLINICAL DATA: Acute onset of intermittent sharp mid chest pain.
Initial encounter.

EXAM:
CHEST  2 VIEW

[chest pa]
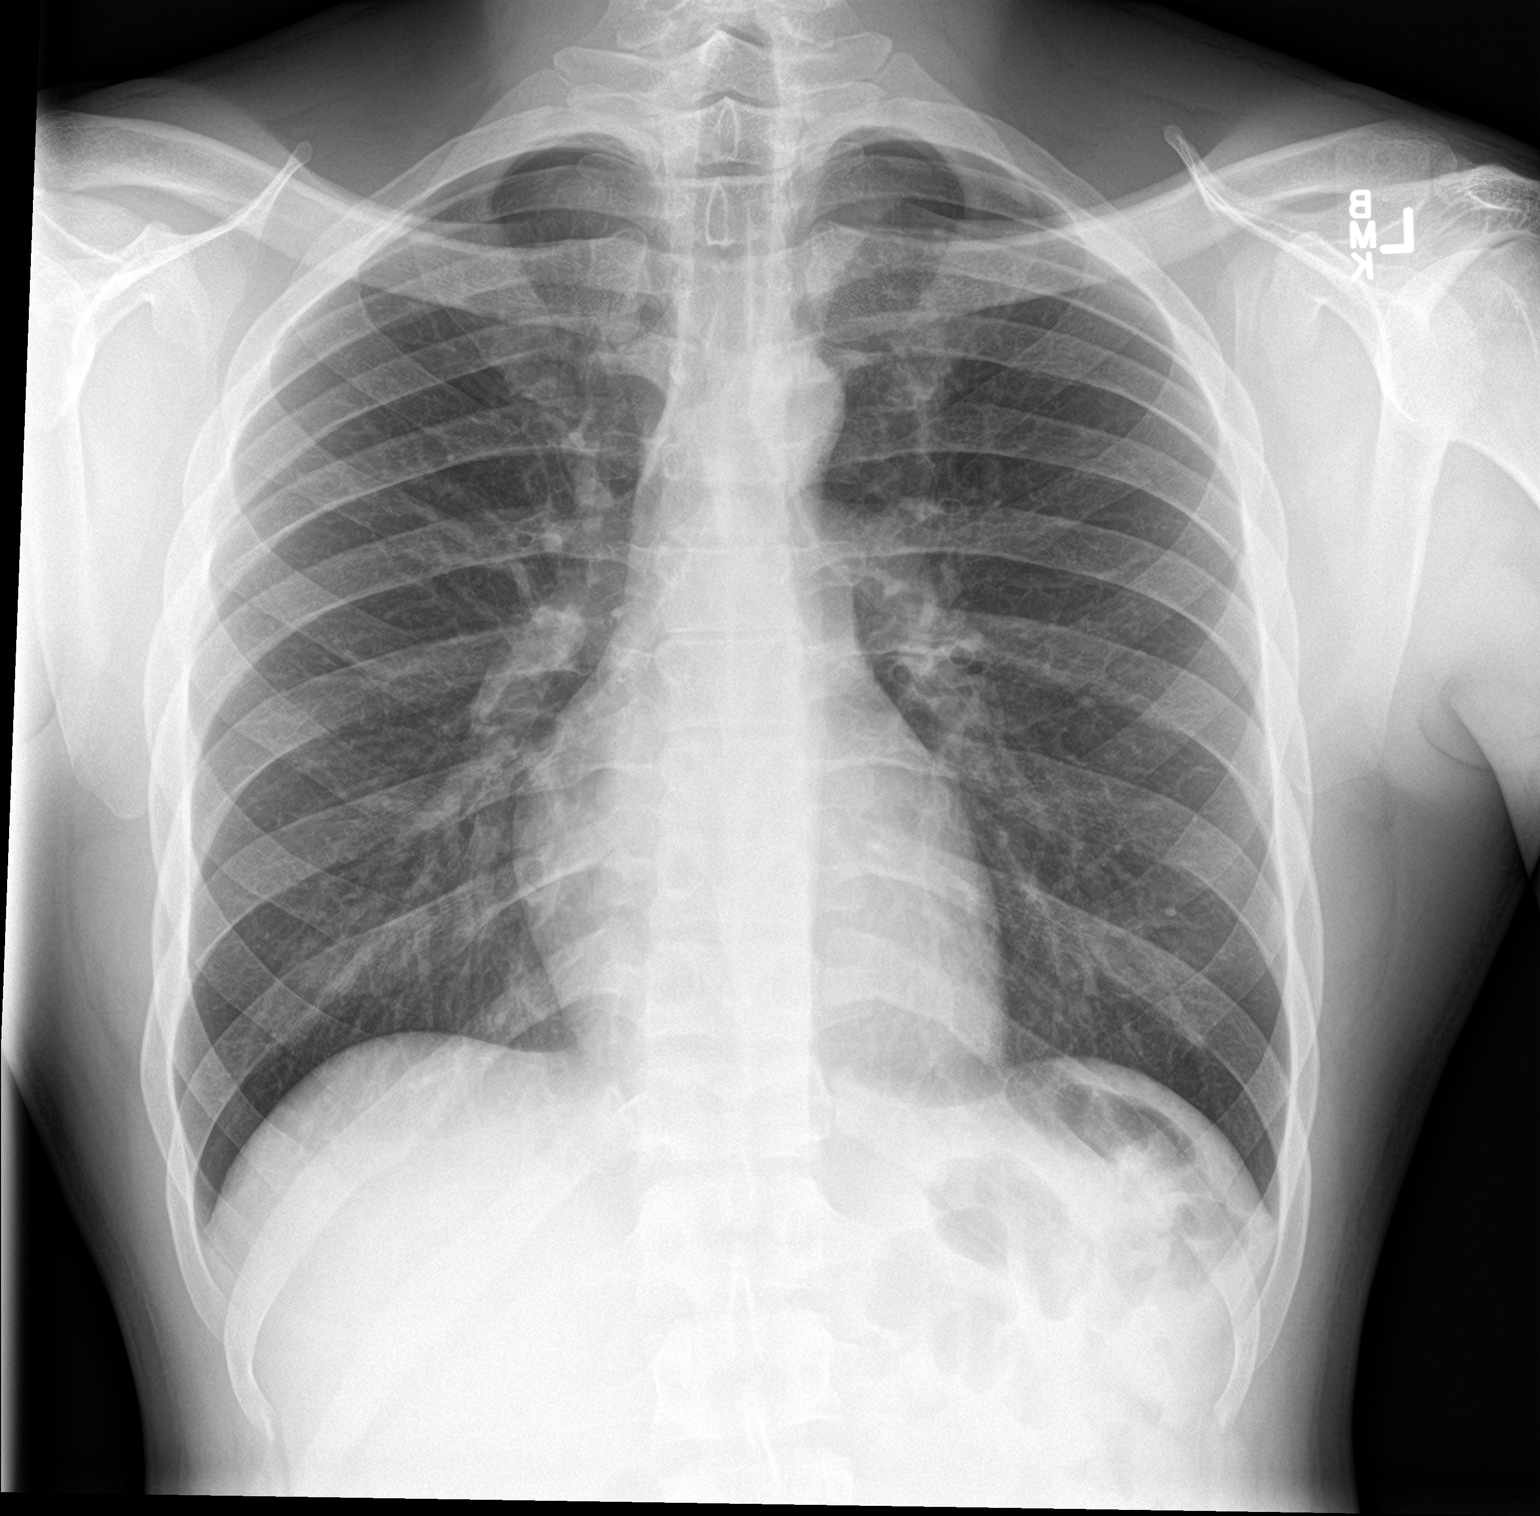

[chest lat]
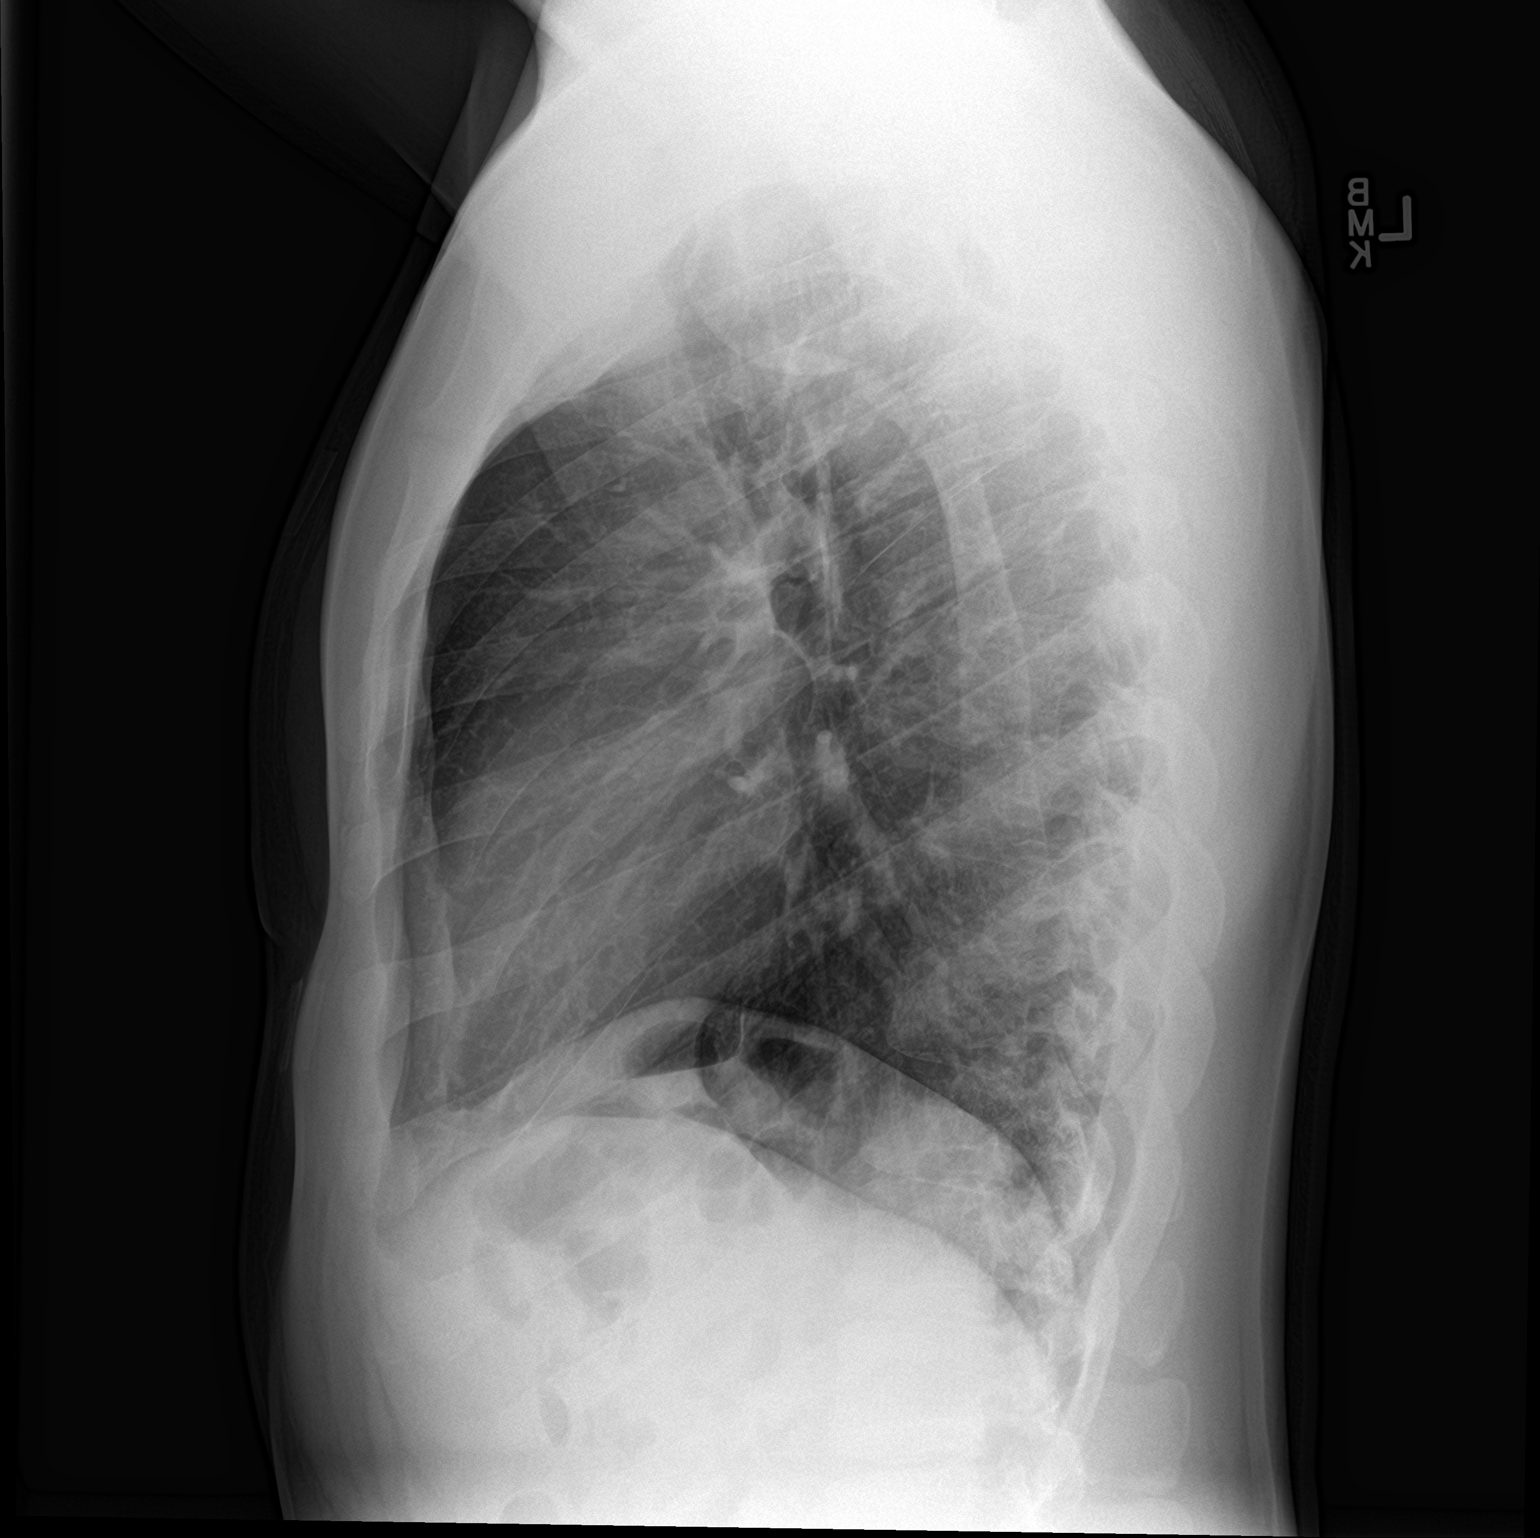

[2 of 2 positions shown; findings below may reference images not displayed]

FINDINGS: The lungs are well-aerated and clear. There is no evidence of focal
opacification, pleural effusion or pneumothorax.

The heart is normal in size; the mediastinal contour is within
normal limits. No acute osseous abnormalities are seen.
IMPRESSION: No acute cardiopulmonary process seen.

## 2018-09-28 ENCOUNTER — Other Ambulatory Visit: Payer: Self-pay

## 2018-09-28 ENCOUNTER — Ambulatory Visit
Admission: EM | Admit: 2018-09-28 | Discharge: 2018-09-28 | Disposition: A | Discharge status: Home / Self Care | Payer: Self-pay | Attending: Family Medicine | Admitting: Family Medicine

## 2018-09-28 ENCOUNTER — Encounter: Payer: Self-pay | Admitting: Emergency Medicine

## 2018-09-28 DIAGNOSIS — J029 Acute pharyngitis, unspecified: Secondary | ICD-10-CM

## 2018-09-28 DIAGNOSIS — R590 Localized enlarged lymph nodes: Secondary | ICD-10-CM

## 2018-09-28 DIAGNOSIS — F17210 Nicotine dependence, cigarettes, uncomplicated: Secondary | ICD-10-CM

## 2018-09-28 LAB — RAPID STREP SCREEN (MED CTR MEBANE ONLY): STREPTOCOCCUS, GROUP A SCREEN (DIRECT): NEGATIVE

## 2018-09-28 MED ORDER — AMOXICILLIN-POT CLAVULANATE 875-125 MG PO TABS: 1.0000 | ORAL_TABLET | Freq: Two times a day (BID) | ORAL | 0 refills | Status: DC

## 2018-09-28 NOTE — ED Provider Notes (Signed)
[facility]    CSN: [id number] Arrival date & time: 09/28/18  1238  History   Chief Complaint Chief Complaint  Patient presents with  . Otalgia    left  . Adenopathy   HPI  28 year old male presents with sore throat and lymphadenopathy.  Patient reports a 5-day history of left cervical lymphadenopathy.  He states that he has had a sore throat for the past 3 days.  He states that his pain is severe.  He reports associated ear pain.  He has been taking ibuprofen without resolution but it does improve his pain.  Pain has been quite severe, 9/10 in severity.  Improved to 6/10 with ibuprofen.  Patient denies dental pain.  He states that he does have gum disease.  He was seen in the ER on 3/15.  White count was slightly elevated.  CT lymphadenopathy but was otherwise reassuring.  No other associated symptoms.  No other complaints.  PMH, Surgical Hx, Family Hx, Social History reviewed and updated as below.  Past Medical History:  Diagnosis Date  . Asthma    Past Surgical History:  Procedure Laterality Date  . NECK SURGERY     Home Medications    Prior to Admission medications   Medication Sig Start Date End Date Taking? Authorizing Provider  amoxicillin-clavulanate (AUGMENTIN) 875-125 MG tablet Take 1 tablet by mouth every 12 (twelve) hours. 09/28/18   Tommie Sams, DO    Family History Family History  Problem Relation Age of Onset  . Osteoarthritis Mother   . Diabetes Father     Social History Social History   Tobacco Use  . Smoking status: Current Every Day Smoker    Packs/day: 0.20    Types: Cigarettes  . Smokeless tobacco: Never Used  Substance Use Topics  . Alcohol use: Yes    Alcohol/week: 4.0 standard drinks    Types: 4 Cans of beer per week    Comment: social  . Drug use: No     Allergies   Patient has no known allergies.   Review of Systems Review of Systems  Constitutional: Negative for fever.  HENT: Positive for ear pain and sore  throat.   Hematological: Positive for adenopathy.   Physical Exam Triage Vital Signs ED Triage Vitals  Enc Vitals Group     BP 09/28/18 1257 (!) 140/118     Pulse Rate 09/28/18 1257 94     Resp 09/28/18 1257 16     Temp 09/28/18 1257 98.8 F (37.1 C)     Temp Source 09/28/18 1257 Oral     SpO2 09/28/18 1257 99 %     Weight 09/28/18 1258 210 lb (95.3 kg)     Height 09/28/18 1258 6\' 1"  (1.854 m)     Head Circumference --      Peak Flow --      Pain Score 09/28/18 1257 9     Pain Loc --      Pain Edu? --      Excl. in GC? --    Updated Vital Signs BP (!) 141/95 (BP Location: Right Arm) Comment: lg cuff  Pulse 94   Temp 98.8 F (37.1 C) (Oral)   Resp 16   Ht 6\' 1"  (1.854 m)   Wt 95.3 kg   SpO2 99%   BMI 27.71 kg/m   Visual Acuity Right Eye Distance:   Left Eye Distance:   Bilateral Distance:    Right Eye Near:   Left Eye Near:  Bilateral Near:     Physical Exam Vitals signs and nursing note reviewed.  Constitutional:      General: He is not in acute distress.    Appearance: Normal appearance.  HENT:     Head: Normocephalic and atraumatic.     Right Ear: Tympanic membrane normal.     Left Ear: Tympanic membrane normal.     Mouth/Throat:     Comments: Oropharynx with moderate erythema.  Left tonsillar enlargement.  No evident exudate. Eyes:     General:        Right eye: No discharge.        Left eye: No discharge.     Conjunctiva/sclera: Conjunctivae normal.  Neck:     Comments: Large left-sided anterior cervical lymphadenopathy. Cardiovascular:     Rate and Rhythm: Normal rate and regular rhythm.  Pulmonary:     Effort: Pulmonary effort is normal.     Breath sounds: Normal breath sounds.  Neurological:     Mental Status: He is alert.  Psychiatric:        Mood and Affect: Mood normal.        Behavior: Behavior normal.    UC Treatments / Results  Labs (all labs ordered are listed, but only abnormal results are displayed) Labs Reviewed  RAPID  STREP SCREEN ([facility] ONLY)  CULTURE, GROUP A STREP [facility])    EKG None  Radiology No results found.  Procedures Procedures (including critical care time)  Medications Ordered in UC Medications - No data to display  Initial Impression / Assessment and Plan / UC Course  I have reviewed the triage vital signs and the nursing notes.  Pertinent labs & imaging results that were available during my care of the patient were reviewed by me and considered in my medical decision making (see chart for details).    28 year old male presents with pharyngitis and cervical lymphadenopathy.  Patient has had recent imaging and laboratory studies which are reassuring.  His rapid strep was negative.  Given his clinical picture, I am placing him on Augmentin while awaiting culture.  Advised to see ear nose and throat if his lymphadenopathy persist.  Final Clinical Impressions(s) / UC Diagnoses   Final diagnoses:  Pharyngitis, unspecified etiology  Cervical lymphadenopathy     Discharge Instructions     Antibiotic as prescribed.  If persists (>3 weeks) or you worsen, please call [facility].  Take care  Dr. Adriana Simas    ED Prescriptions    Medication Sig Dispense Auth. Provider   amoxicillin-clavulanate (AUGMENTIN) 875-125 MG tablet Take 1 tablet by mouth every 12 (twelve) hours. 20 tablet Tommie Sams, DO     Controlled Substance Prescriptions [place] Controlled Substance Registry consulted? Not Applicable   Tommie Sams, DO 09/28/18 1402

## 2018-09-28 NOTE — Discharge Instructions (Signed)
Antibiotic as prescribed.  If persists (>3 weeks) or you worsen, please call [facility].  Take care  Dr. Adriana Simas

## 2018-09-28 NOTE — ED Triage Notes (Signed)
Patient in today c/o left ear pain, pain with swallowing on the left side and swollen lymph node on left side of neck x 4-5 days. Patient denies fever. Patient has tried OTC Ibuprofen and warm salt water gargles.

## 2018-10-01 LAB — CULTURE, GROUP A STREP (THRC)

## 2019-05-01 ENCOUNTER — Other Ambulatory Visit: Payer: Self-pay

## 2019-05-01 ENCOUNTER — Ambulatory Visit
Admission: EM | Admit: 2019-05-01 | Discharge: 2019-05-01 | Disposition: A | Discharge status: Home / Self Care | Payer: Self-pay | Attending: Family Medicine | Admitting: Family Medicine

## 2019-05-01 DIAGNOSIS — S61217A Laceration without foreign body of left little finger without damage to nail, initial encounter: Secondary | ICD-10-CM

## 2019-05-01 DIAGNOSIS — W268XXA Contact with other sharp object(s), not elsewhere classified, initial encounter: Secondary | ICD-10-CM

## 2019-05-01 MED ORDER — CEPHALEXIN 500 MG PO CAPS: 500.0000 mg | ORAL_CAPSULE | Freq: Three times a day (TID) | ORAL | 0 refills | Status: AC

## 2019-05-01 NOTE — ED Provider Notes (Signed)
[facility] ____________________________________________  Time seen: Approximately 5:28 PM  I have reviewed the triage vital signs and the nursing notes.   HISTORY  Chief Complaint Laceration   HPI Derek Spencer is a 28 y.o. male presenting for evaluation of left fifth digit laceration that occurred around 1030 this morning.  Patient reports he was working with some very sharp metal plates and when he was changing the plates he accidentally hit his hand against the metal edge causing the laceration.  States he is plates are sharp.  Denies any crushing injury.  States minimal pain to the finger itself.  Denies decreased range of motion, paresthesia, other injury.  Reports last tetanus immunization is less than 5 years ago.  States did clean it immediately and applied dressing.  Denies other aggravating or alleviating factors.  Reports otherwise doing well.   Past Medical History:  Diagnosis Date  . Asthma     There are no active problems to display for this patient.   Past Surgical History:  Procedure Laterality Date  . NECK SURGERY       No current facility-administered medications for this encounter.   Current Outpatient Medications:  .  cephALEXin (KEFLEX) 500 MG capsule, Take 1 capsule (500 mg total) by mouth 3 (three) times daily for 7 days., Disp: 21 capsule, Rfl: 0  Allergies Patient has no known allergies.  Family History  Problem Relation Age of Onset  . Osteoarthritis Mother   . Diabetes Father     Social History Social History   Tobacco Use  . Smoking status: Current Every Day Smoker    Packs/day: 0.20    Types: Cigarettes  . Smokeless tobacco: Never Used  Substance Use Topics  . Alcohol use: Yes    Alcohol/week: 4.0 standard drinks    Types: 4 Cans of beer per week    Comment: social  . Drug use: No    Review of Systems Constitutional: No fever ENT: No sore throat. Cardiovascular: Denies chest pain. Respiratory: Denies  shortness of breath. Gastrointestinal: No abdominal pain.   Musculoskeletal: Negative for back pain. Skin: Positive laceration.   ____________________________________________   PHYSICAL EXAM:  VITAL SIGNS: ED Triage Vitals  Enc Vitals Group     BP 05/01/19 1709 130/89     Pulse Rate 05/01/19 1709 67     Resp 05/01/19 1709 18     Temp 05/01/19 1709 98.9 F (37.2 C)     Temp Source 05/01/19 1709 Oral     SpO2 05/01/19 1709 98 %     Weight 05/01/19 1553 220 lb (99.8 kg)     Height 05/01/19 1553 6\' 2"  (1.88 m)     Head Circumference --      Peak Flow --      Pain Score 05/01/19 1553 2     Pain Loc --      Pain Edu? --      Excl. in GC? --     Constitutional: Alert and oriented. Well appearing and in no acute distress. Eyes: Conjunctivae are normal.  ENT      Head: Normocephalic and atraumatic. Cardiovascular: Normal rate, regular rhythm. Grossly normal heart sounds.  Good peripheral circulation. Respiratory: Normal respiratory effort without tachypnea nor retractions. Breath sounds are clear and equal bilaterally. No wheezes, rales, rhonchi. Musculoskeletal: Steady gait.  Neurologic:  Normal speech and language.Speech is normal. No gait instability.  Skin:  Skin is warm, dry.  Except: Left fifth digit proximal phalanx lateral aspect 2.5  cm linear laceration present with minimal active bleeding, no foreign body, no bony tenderness, full range of motion present, normal distal sensation and capillary refill, left hand otherwise nontender. Psychiatric: Mood and affect are normal. Speech and behavior are normal. Patient exhibits appropriate insight and judgment   ___________________________________________   LABS (all labs ordered are listed, but only abnormal results are displayed)  Labs Reviewed - No data to display ___________________________________________________________________________________   PROCEDURES Procedures   Procedure(s) performed:  Procedure explained  and verbal consent obtained. Consent: Verbal consent obtained. Written consent not obtained. Risks and benefits: risks, benefits and alternatives were discussed Patient identity confirmed: verbally with patient and hospital-assigned identification number  Consent given by: patient   Laceration Repair Location: Left fifth digit Length: 2.5 cm Foreign bodies: no foreign bodies Tendon involvement: none Nerve involvement: none Preparation: Patient was prepped and draped in the usual sterile fashion. Anesthesia with 1% Lidocaine 3 mls Cleaned with Betadine Irrigation solution: Sterile water Irrigation method: jet lavage Amount of cleaning: copious Repaired with 6-0 nylon Number of sutures: 3 Technique: simple interrupted  Approximation: loose Patient tolerate well. Wound well approximated post repair.  Antibiotic ointment and dressing applied.  Wound care instructions provided.  Observe for any signs of infection or other problems.      INITIAL IMPRESSION / ASSESSMENT AND PLAN / ED COURSE  Pertinent labs & imaging results that were available during my care of the patient were reviewed by me and considered in my medical decision making (see chart for details).  Well-appearing patient.  No acute distress.  Left fifth digit laceration copiously cleaned, irrigated and repaired as above.  Empirically treat with Keflex.  Topical antibiotic ointment.  Discussed keeping clean, wound care and allowing open air time.  Return in 7 to 10 days for suture removal.Discussed indication, risks and benefits of medications with patient.  Discussed follow up and return parameters including no resolution or any worsening concerns. Patient verbalized understanding and agreed to plan.   ____________________________________________   FINAL CLINICAL IMPRESSION(S) / ED DIAGNOSES  Final diagnoses:  Laceration of left little finger without foreign body without damage to nail, initial encounter     ED  Discharge Orders         Ordered    cephALEXin (KEFLEX) 500 MG capsule  3 times daily     05/01/19 1703           Note: This dictation was prepared with Dragon dictation along with smaller phrase technology. Any transcriptional errors that result from this process are unintentional.         Marylene Land, NP 05/01/19 1732

## 2019-05-01 NOTE — Discharge Instructions (Addendum)
Take medication as prescribed, as discussed.  Topical ointment. Keep clean. Allow open to air time.   Return for suture removal in 7 to 10 days.  Follow up with your primary care physician this week as needed. Return to Urgent care for new or worsening concerns.

## 2019-05-01 NOTE — ED Triage Notes (Signed)
Patient complains of finger laceration that occurred around 1030am. 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 Patient states that he put a plate in a machine and his finger was caught in the plate and sliced his finger.

## 2019-06-26 ENCOUNTER — Telehealth: Payer: Self-pay | Admitting: Family Medicine

## 2019-06-26 NOTE — Telephone Encounter (Signed)
Phone call to pt. Left message on voicemail that RN with [facility] is returning phone call, if still need assistance or would like to make appt, call [phone]

## 2019-06-26 NOTE — Telephone Encounter (Signed)
PATIENT WANTS HIV & HEP C TESTING DONE, AND WOULD LIKE TO SPEAK TO NURSE ABOUT IT.

## 2019-06-28 ENCOUNTER — Ambulatory Visit: Payer: Self-pay

## 2019-06-28 NOTE — Telephone Encounter (Signed)
This RN returned pt's call at phone # provided. Call went to voicemail and message left to return call to [facility] if he still needs our assistance and call back phone # provided.Ronny Bacon, RN

## 2019-07-02 ENCOUNTER — Telehealth: Payer: Self-pay

## 2019-07-02 NOTE — Telephone Encounter (Signed)
3 attempts made to contact patient. Aileen Fass, RN

## 2019-10-06 ENCOUNTER — Ambulatory Visit: Payer: Self-pay | Attending: Internal Medicine

## 2019-10-06 DIAGNOSIS — Z23 Encounter for immunization: Secondary | ICD-10-CM

## 2019-10-06 NOTE — Progress Notes (Signed)
   Covid-19 Vaccination Clinic  Name:  Derek Spencer    MRN: [id number] DOB: 1990-11-17  10/06/2019  [name] was observed post Covid-19 immunization for 15 minutes without incident. He was provided with Vaccine Information Sheet and instruction to access the V-Safe system.   [name] was instructed to call 911 with any severe reactions post vaccine: Marland Kitchen Difficulty breathing  . Swelling of face and throat  . A fast heartbeat  . A bad rash all over body  . Dizziness and weakness   Immunizations Administered    Name Date Dose VIS Date Route   Pfizer COVID-19 Vaccine 10/06/2019 11:01 AM 0.3 mL 06/22/2019 Intramuscular   Manufacturer: [employer], [employer]   Lot: [id number]   NDC: [address]-2

## 2019-10-27 ENCOUNTER — Ambulatory Visit: Payer: Self-pay

## 2021-08-28 ENCOUNTER — Other Ambulatory Visit: Payer: Self-pay

## 2021-08-28 ENCOUNTER — Ambulatory Visit
Admission: EM | Admit: 2021-08-28 | Discharge: 2021-08-28 | Disposition: A | Discharge status: Home / Self Care | Payer: BLUE CROSS/BLUE SHIELD | Attending: Emergency Medicine | Admitting: Emergency Medicine

## 2021-08-28 DIAGNOSIS — M25511 Pain in right shoulder: Secondary | ICD-10-CM | POA: Diagnosis not present

## 2021-08-28 DIAGNOSIS — M25512 Pain in left shoulder: Secondary | ICD-10-CM

## 2021-08-28 MED ORDER — CYCLOBENZAPRINE HCL 10 MG PO TABS: 10.0000 mg | ORAL_TABLET | Freq: Every day | ORAL | 0 refills | Status: AC

## 2021-08-28 MED ORDER — PREDNISONE 20 MG PO TABS: 40.0000 mg | ORAL_TABLET | Freq: Every day | ORAL | 0 refills | Status: AC

## 2021-08-28 MED ORDER — KETOROLAC TROMETHAMINE 60 MG/2ML IM SOLN
30.0000 mg | Freq: Once | INTRAMUSCULAR | Status: AC
  Administered 2021-08-28: 30 mg via INTRAMUSCULAR

## 2021-08-28 NOTE — ED Provider Notes (Signed)
[facility]    CSN: [id number] Arrival date & time: 08/28/21  1328      History   Chief Complaint Chief Complaint  Patient presents with   Shoulder Pain    Bilateral     HPI Derek Spencer is a 31 y.o. male.   Patient presents with bilateral shoulder pain for 5 days, beginning  3 days ago  pain began radiating to lateral aspects of neck.  Pain is worsened when raising arms above head.  Denies numbness, tingling, prior injury or trauma.  Endorses that he lives boxes at work that are greater than 50 pounds.  Has attempted use of ibuprofen and hot showers which have been somewhat helpful.    Past Medical History:  Diagnosis Date   Asthma     There are no problems to display for this patient.   Past Surgical History:  Procedure Laterality Date   NECK SURGERY         Home Medications    Prior to Admission medications   Not on File    Family History Family History  Problem Relation Age of Onset   Osteoarthritis Mother    Diabetes Father     Social History Social History   Tobacco Use   Smoking status: Former    Packs/day: 0.20    Types: Cigarettes    Quit date: 08/28/2021   Smokeless tobacco: Never  Vaping Use   Vaping Use: Every day  Substance Use Topics   Alcohol use: Yes    Alcohol/week: 4.0 standard drinks    Types: 4 Cans of beer per week    Comment: social   Drug use: No     Allergies   Patient has no known allergies.   Review of Systems Review of Systems  Constitutional: Negative.   Respiratory: Negative.    Cardiovascular: Negative.   Gastrointestinal: Negative.   Musculoskeletal:  Positive for myalgias and neck stiffness. Negative for arthralgias, back pain, gait problem, joint swelling and neck pain.  Skin: Negative.   Neurological: Negative.     Physical Exam Triage Vital Signs ED Triage Vitals  Enc Vitals Group     BP 08/28/21 1411 (!) 147/92     Pulse Rate 08/28/21 1411 82     Resp 08/28/21 1411 16      Temp 08/28/21 1411 98.6 F (37 C)     Temp Source 08/28/21 1411 Oral     SpO2 08/28/21 1411 99 %     Weight --      Height --      Head Circumference --      Peak Flow --      Pain Score 08/28/21 1408 0     Pain Loc --      Pain Edu? --      Excl. in GC? --    No data found.  Updated Vital Signs BP (!) 147/92 (BP Location: Left Arm)    Pulse 82    Temp 98.6 F (37 C) (Oral)    Resp 16    SpO2 99%   Visual Acuity Right Eye Distance:   Left Eye Distance:   Bilateral Distance:    Right Eye Near:   Left Eye Near:    Bilateral Near:     Physical Exam Constitutional:      Appearance: Normal appearance.  HENT:     Head: Normocephalic.  Eyes:     Extraocular Movements: Extraocular movements intact.  Pulmonary:  Effort: Pulmonary effort is normal.  Musculoskeletal:     Comments: Bilateral tenderness regards to shoulder blade, no point tenderness noted, range of motion intact but does elicit pain with limited extension, no involvement of the cervical spine, negative Hawkins signs  Skin:    General: Skin is warm and dry.  Neurological:     Mental Status: He is alert and oriented to person, place, and time. Mental status is at baseline.  Psychiatric:        Mood and Affect: Mood normal.        Behavior: Behavior normal.     UC Treatments / Results  Labs (all labs ordered are listed, but only abnormal results are displayed) Labs Reviewed - No data to display  EKG   Radiology No results found.  Procedures Procedures (including critical care time)  Medications Ordered in UC Medications - No data to display  Initial Impression / Assessment and Plan / UC Course  I have reviewed the triage vital signs and the nursing notes.  Pertinent labs & imaging results that were available during my care of the patient were reviewed by me and considered in my medical decision making (see chart for details).  Acute pain of both shoulders  Etiology of symptoms is most  likely muscular related to lifting at work, will defer imaging at this time, discussed with patient, Toradol injection given in office for management of pain, prednisone course and Flexeril prescribed for outpatient treatment, recommended RICE, heat in 15-minute intervals, pillows for support, daily stretching and activity as tolerated, walking referral given for orthopedics, urgent care follow-up as needed Final Clinical Impressions(s) / UC Diagnoses   Final diagnoses:  None   Discharge Instructions   None    ED Prescriptions   None    PDMP not reviewed this encounter.   Valinda Hoar, NP 08/28/21 1455

## 2021-08-28 NOTE — Discharge Instructions (Signed)
Your pain is most likely caused by irritation to the muscles or ligaments.   Starting tomorrow take prednisone every morning with food for 5 days    May use steroid at bedtime for additional comfort   You may use heating pad in 15 minute intervals as needed for additional comfort, or you may find comfort in using ice in 10-15 minutes over affected area  Begin stretching affected area daily for 10 minutes as tolerated to further loosen muscles   When lying down place pillow underneath shoulders and arms  Can try sleeping without pillow on firm mattress   Practice good posture: head back, shoulders back, chest forward, pelvis back and weight distributed evenly on both legs  If pain persist after recommended treatment or reoccurs if may be beneficial to follow up with orthopedic specialist for evaluation, this doctor specializes in the bones and can manage your symptoms long-term with options such as but not limited to imaging, medications or physical therapy

## 2021-08-28 NOTE — ED Triage Notes (Signed)
Pt presents with bilateral shoulder/trapezius pain radiating to neck worsening x 5 days.  Painful to raise both arms but is able to do it.  A little relief with hot showers and Ibuprofen. No numbness/tingling.    No known injury but does lift up to 50 lbs regularly at work.

## 2021-08-31 ENCOUNTER — Other Ambulatory Visit: Payer: Self-pay

## 2021-08-31 ENCOUNTER — Ambulatory Visit
Admission: EM | Admit: 2021-08-31 | Discharge: 2021-08-31 | Disposition: A | Discharge status: Home / Self Care | Payer: BLUE CROSS/BLUE SHIELD | Attending: Emergency Medicine | Admitting: Emergency Medicine

## 2021-08-31 DIAGNOSIS — K047 Periapical abscess without sinus: Secondary | ICD-10-CM

## 2021-08-31 MED ORDER — AMOXICILLIN-POT CLAVULANATE 875-125 MG PO TABS: 1.0000 | ORAL_TABLET | Freq: Two times a day (BID) | ORAL | 0 refills | Status: AC

## 2021-08-31 NOTE — ED Triage Notes (Signed)
Patient is here for "Dental pain". ? Wisdom teeth growing in causing pan. Bottom right, back of mouth, teeth. No fever. No injury.

## 2021-08-31 NOTE — Discharge Instructions (Signed)
Take the Augmentin twice daily with food for 10 days for treatment of your dental infection.  Use over-the-counter Tylenol and ibuprofen for swelling and mild to moderate pain.  Rinse with warm salt water, or Listerine, after each meal to remove food particles and wash away any pus that is collecting.  If you develop any increasing or swelling, fever, pain, or difficulty swallowing you to go to the emergency department at [facility] with a have an oral surgeon and also a dentist on-call.  

## 2021-08-31 NOTE — ED Provider Notes (Signed)
[facility]    CSN: [id number] Arrival date & time: 08/31/21  1207      History   Chief Complaint Chief Complaint  Patient presents with   Dental Pain    HPI Derek Spencer is a 31 y.o. male.   HPI  31 year old male here for evaluation of dental pain.  Patient reports that he has been experiencing pain in his right rear molar that is also causing to have pain on the right side of his throat and also intermittent right-sided headaches.  This is not associated with fever or drainage but patient does state that he has a metallic taste in his mouth.  The patient reports that he feels a bump behind his rear molar and when he runs his tongue over the bump he has increased and that metallic taste.  He has not measured a fever but states that he has felt intermittently feverish and warm over the past several days.  He does have a dentist but he does not have an appointment until the middle of March.  Past Medical History:  Diagnosis Date   Asthma     There are no problems to display for this patient.   Past Surgical History:  Procedure Laterality Date   NECK SURGERY         Home Medications    Prior to Admission medications   Medication Sig Start Date End Date Taking? Authorizing Provider  amoxicillin-clavulanate (AUGMENTIN) 875-125 MG tablet Take 1 tablet by mouth every 12 (twelve) hours for 10 days. 08/31/21 09/10/21 Yes Becky Augusta, NP  cyclobenzaprine (FLEXERIL) 10 MG tablet Take 1 tablet (10 mg total) by mouth at bedtime. 08/28/21   White, Elita Boone, NP  predniSONE (DELTASONE) 20 MG tablet Take 2 tablets (40 mg total) by mouth daily. 08/28/21   Valinda Hoar, NP    Family History Family History  Problem Relation Age of Onset   Osteoarthritis Mother    Diabetes Father     Social History Social History   Tobacco Use   Smoking status: Former    Packs/day: 0.20    Types: Cigarettes    Quit date: 08/28/2021   Smokeless tobacco: Never  Vaping Use    Vaping Use: Every day  Substance Use Topics   Alcohol use: Yes    Alcohol/week: 4.0 standard drinks    Types: 4 Cans of beer per week    Comment: social   Drug use: No     Allergies   Patient has no known allergies.   Review of Systems Review of Systems  Constitutional:  Negative for fever.  HENT:  Positive for dental problem and sore throat.   Neurological:  Positive for headaches.    Physical Exam Triage Vital Signs ED Triage Vitals  Enc Vitals Group     BP 08/31/21 1214 (!) 137/100     Pulse Rate 08/31/21 1214 86     Resp 08/31/21 1214 20     Temp 08/31/21 1214 98.3 F (36.8 C)     Temp Source 08/31/21 1214 Oral     SpO2 08/31/21 1214 98 %     Weight 08/31/21 1217 275 lb (124.7 kg)     Height 08/31/21 1217 6\' 1"  (1.854 m)     Head Circumference --      Peak Flow --      Pain Score 08/31/21 1214 7     Pain Loc --      Pain Edu? --  Excl. in GC? --    No data found.  Updated Vital Signs BP (!) 137/100 (BP Location: Left Arm)    Pulse 86    Temp 98.3 F (36.8 C) (Oral)    Resp 20    Ht 6\' 1"  (1.854 m)    Wt 275 lb (124.7 kg)    SpO2 98%    BMI 36.28 kg/m   Visual Acuity Right Eye Distance:   Left Eye Distance:   Bilateral Distance:    Right Eye Near:   Left Eye Near:    Bilateral Near:     Physical Exam Vitals and nursing note reviewed.  Constitutional:      Appearance: Normal appearance. He is not ill-appearing.  HENT:     Head: Normocephalic and atraumatic.     Mouth/Throat:     Mouth: Mucous membranes are moist.     Pharynx: Oropharyngeal exudate and posterior oropharyngeal erythema present.  Musculoskeletal:     Cervical back: Normal range of motion and neck supple. No tenderness.  Lymphadenopathy:     Cervical: No cervical adenopathy.  Skin:    General: Skin is warm and dry.     Capillary Refill: Capillary refill takes less than 2 seconds.     Findings: No erythema or rash.  Neurological:     General: No focal deficit present.      Mental Status: He is alert and oriented to person, place, and time.  Psychiatric:        Mood and Affect: Mood normal.        Behavior: Behavior normal.        Thought Content: Thought content normal.        Judgment: Judgment normal.     UC Treatments / Results  Labs (all labs ordered are listed, but only abnormal results are displayed) Labs Reviewed - No data to display  EKG   Radiology No results found.  Procedures Procedures (including critical care time)  Medications Ordered in UC Medications - No data to display  Initial Impression / Assessment and Plan / UC Course  I have reviewed the triage vital signs and the nursing notes.  Pertinent labs & imaging results that were available during my care of the patient were reviewed by me and considered in my medical decision making (see chart for details).  Patient is a nontoxic-appearing 31 year old male here for evaluation of pain behind his lower teeth on the right-hand side of his mandible.  He has a 26 but does not have an appointment until mid March.  He came in at the behest of his wife for evaluation.  On exam patient's face is symmetrical and there is no apparent swelling.  Intraoral evaluation reveals a boggy pocket of tissue behind the right rear molar that is creeping up over top of the back of the molar.  There is very mild erythema along the gumline but the tissue itself is not erythematous.  It is boggy when palpated with a tongue blade and tender to touch as well.  I am able to lift the tissue off of the tooth and reveal that there is some yellow drainage coming from this pocket of swollen tissue.  Posterior oropharynx is unremarkable.  There is no anterior cervical, submandibular, or submental fullness or induration appreciated on exam.  No lymph nodes in those areas palpable either.  Advised patient that he needs to contact his dentist to see if he can be evaluated sooner.  I suspect that there is  probably a pocket  that is formed in the tissue that is collected food particles and is now causing an infection.  I will place the patient on Augmentin twice daily for 10 days for treatment of the presumptive dental infection.  I also advised the patient that if his symptoms worsen, including swelling of his face, fever, drainage, or there is an increase in pain that he should go to the emergency department at [facility] for evaluation by the dentist on-call.  Work note provided.   Final Clinical Impressions(s) / UC Diagnoses   Final diagnoses:  Dental infection     Discharge Instructions      Take the Augmentin twice daily with food for 10 days for treatment of your dental infection.  Use over-the-counter Tylenol and ibuprofen for swelling and mild to moderate pain.  Rinse with warm salt water, or Listerine, after each meal to remove food particles and wash away any pus that is collecting.  If you develop any increasing or swelling, fever, pain, or difficulty swallowing you to go to the emergency department at [facility] with a have an oral surgeon and also a dentist on-call.      ED Prescriptions     Medication Sig Dispense Auth. Provider   amoxicillin-clavulanate (AUGMENTIN) 875-125 MG tablet Take 1 tablet by mouth every 12 (twelve) hours for 10 days. 20 tablet Becky Augusta, NP      I have reviewed the PDMP during this encounter.   Becky Augusta, NP 08/31/21 1331
